# Patient Record
Sex: Female | Born: 1960
Health system: Southern US, Community
[De-identification: ages and names within clinical notes are randomized; demographics above are authoritative.]

## PROBLEM LIST (undated history)

## (undated) DIAGNOSIS — T7840XA Allergy, unspecified, initial encounter: Secondary | ICD-10-CM

## (undated) HISTORY — DX: Allergy, unspecified, initial encounter: T78.40XA

---

## 2001-06-18 ENCOUNTER — Encounter: Payer: Self-pay | Admitting: Ophthalmology

## 2001-06-18 ENCOUNTER — Encounter: Admission: RE | Admit: 2001-06-18 | Discharge: 2001-06-18 | Payer: Self-pay | Admitting: Ophthalmology

## 2007-07-20 ENCOUNTER — Other Ambulatory Visit: Admission: RE | Admit: 2007-07-20 | Discharge: 2007-07-20 | Payer: Self-pay | Admitting: Family Medicine

## 2010-02-09 ENCOUNTER — Ambulatory Visit: Payer: Self-pay | Admitting: Gynecology

## 2010-02-09 ENCOUNTER — Other Ambulatory Visit: Admission: RE | Admit: 2010-02-09 | Discharge: 2010-02-09 | Payer: Self-pay | Admitting: Gynecology

## 2010-06-20 ENCOUNTER — Encounter: Payer: Self-pay | Admitting: Family Medicine

## 2012-07-17 ENCOUNTER — Ambulatory Visit
Admission: RE | Admit: 2012-07-17 | Discharge: 2012-07-17 | Disposition: A | Discharge status: Home / Self Care | Payer: BC Managed Care – PPO | Source: Ambulatory Visit | Attending: Cardiovascular Disease | Admitting: Cardiovascular Disease

## 2012-07-17 ENCOUNTER — Other Ambulatory Visit: Payer: Self-pay | Admitting: Cardiovascular Disease

## 2012-07-17 DIAGNOSIS — R519 Headache, unspecified: Secondary | ICD-10-CM

## 2012-07-17 DIAGNOSIS — R110 Nausea: Secondary | ICD-10-CM

## 2013-08-06 ENCOUNTER — Ambulatory Visit (INDEPENDENT_AMBULATORY_CARE_PROVIDER_SITE_OTHER): Payer: BC Managed Care – PPO | Admitting: Emergency Medicine

## 2013-08-06 VITALS — BP 110/90 | HR 66 | Temp 98.2°F | Resp 16 | Ht <= 58 in | Wt 128.8 lb

## 2013-08-06 DIAGNOSIS — K2970 Gastritis, unspecified, without bleeding: Secondary | ICD-10-CM

## 2013-08-06 DIAGNOSIS — K2990 Gastroduodenitis, unspecified, without bleeding: Secondary | ICD-10-CM

## 2013-08-06 DIAGNOSIS — R1013 Epigastric pain: Secondary | ICD-10-CM

## 2013-08-06 LAB — POCT CBC
Granulocyte percent: 41.1 %G (ref 37–80)
HCT, POC: 44.5 % (ref 37.7–47.9)
Hemoglobin: 14 g/dL (ref 12.2–16.2)
Lymph, poc: 2.8 (ref 0.6–3.4)
MCH, POC: 30 pg (ref 27–31.2)
MCHC: 31.5 g/dL — AB (ref 31.8–35.4)
MCV: 95.3 fL (ref 80–97)
MID (cbc): 0.3 (ref 0–0.9)
MPV: 9.1 fL (ref 0–99.8)
POC Granulocyte: 2.2 (ref 2–6.9)
POC LYMPH PERCENT: 52.3 %L — AB (ref 10–50)
POC MID %: 6.6 %M (ref 0–12)
Platelet Count, POC: 288 10*3/uL (ref 142–424)
RBC: 4.67 M/uL (ref 4.04–5.48)
RDW, POC: 14.3 %
WBC: 5.3 10*3/uL (ref 4.6–10.2)

## 2013-08-06 MED ORDER — SUCRALFATE 1 G PO TABS: ORAL_TABLET | ORAL | Status: DC

## 2013-08-06 MED ORDER — ESOMEPRAZOLE MAGNESIUM 40 MG PO CPDR: 40.0000 mg | DELAYED_RELEASE_CAPSULE | Freq: Every day | ORAL | Status: DC

## 2013-08-06 NOTE — Patient Instructions (Signed)
Viêm D? Dày, Ng??i L?n  (Gastritis, Adult)  Viêm d? dày là hi?n t??ng ?au nh?c và s?ng (viêm) ? l?p niêm m?c d? dày. Viêm d? dày có th? phát tri?n nh? là m?t tình tr?ng kh?i phát ??t ng?t (c?p tính) ho?c kéo dài (m?n tính). N?u viêm d? dày không ???c ?i?u tr?, b?nh có th? d?n ??n ch?y máu và loét d? dày.   NGUYÊN NHÂN  Viêm d? dày xu?t hi?n khi l?p niêm m?c d? dày y?u ho?c b? t?n th??ng. Sau ?ó, d?ch tiêu hóa t? d? dày gây viêm niêm m?c d? dày b? suy y?u. Niêm m?c d? dày có th? y?u ho?c b? t?n th??ng do nhi?m vi rút ho?c vi khu?n. M?t nhi?m trùng do vi khu?n ph? bi?n là nhi?m trùng do Helicobacter pylori. Viêm d? dày c?ng có th? do u?ng r??u quá nhi?u, dùng m?t s? lo?i thu?c nh?t ??nh ho?c có quá nhi?u axit trong d? dày.  TRI?U CH?NG  Trong m?t s? tr??ng h?p, không có tri?u ch?ng. Khi có tri?u ch?ng, chúng có th? bao g?m:  · ?au ho?c c?m giác nóng rát ? vùng b?ng trên.  · Bu?n nôn.  · Nôn.  · C?m giác khó ch?u do no sau khi ?n.  CH?N ?OÁN  Chuyên gia ch?m sóc s?c kh?e có th? nghi ng? b?n b? viêm d? dày d?a vào các tri?u ch?ng c?a b?n và khám th?c th?. ?? xác ??nh nguyên nhân viêm d? dày, chuyên gia ch?m sóc s?c kh?e có th? th?c hi?n nh? sau:  · Xét nghi?m máu ho?c phân ?? ki?m tra vi khu?n H pylori.  · Soi d? dày. M?t ?ng m?ng, m?m (?èn n?i soi) ???c lu?n xu?ng th?c qu?n và vào d? dày. ?èn n?i soi có g?n ?èn và camera ? ??u. Chuyên gia ch?m sóc s?c kh?e s? d?ng ?èn n?i soi ?? xem bên trong d? dày.  · L?y m?u mô (sinh thi?t) t? d? dày ?? ki?m tra d??i kính hi?n vi.  ?I?U TR?  Tùy thu?c vào nguyên nhân gây viêm d? dày, thu?c có th? ???c kê ??n. N?u b?n b? nhi?m trùng do vi khu?n, ch?ng h?n nh? nhi?m trùng do H pylori, kháng sinh có th? ???c cho dùng. N?u viêm d? dày là do có quá nhi?u axit trong d? dày, thu?c ch?n H2 ho?c thu?c trung hòa axit có th? ???c cho dùng. Chuyên gia ch?m sóc s?c kh?e có th? khuy?n ngh? b?n nên ng?ng dùng atpirin, ibuprofen ho?c thu?c kháng viêm không có steroid khác (NSAID).  H??NG D?N CH?M  SÓC T?I NHÀ  · Ch? s? d?ng thu?c không c?n kê toa ho?c thu?c c?n kê toa theo ch? d?n c?a chuyên gia ch?m sóc s?c kh?e.  · N?u b?n ???c cho dùng thu?c kháng sinh, hãy dùng theo ch? d?n. Dùng h?t thu?c ngay c? khi b?n b?t ??u c?m th?y ?? h?n.  · U?ng ?? n??c ?? gi? cho n??c ti?u trong ho?c vàng nh?t.  · Tránh các lo?i th?c ph?m và ?? u?ng làm cho các tri?u ch?ng t?i t? h?n, ch?ng h?n nh?:  · Caffeine ho?c ?? u?ng có c?n.  · Sô cô la.  · B?c hà ho?c v? b?c hà.  · T?i và hành tây.  · Th?c ?n cay.  · Trái cây h? cam, ch?ng h?n nh? cam, chanh hay chanh tây.  · Các th?c ?n có cà chua, ch?ng h?n nh? n??c x?t, ?t, salsa (n??c x?t cay) và bánh   L?P T?C ?I KHM N?U:  Phn c mu ?en ho?c ?? s?m.  B?n nn ra mu ho?c ch?t gi?ng nh? b?t c ph.  B?n khng th? gi? ch?t l?ng trong ng??i.  B?n b? ?au b?ng tr?m tr?ng h?n.  B?n b? s?t.  B?n khng c?m th?y kh?e h?n sau 1 tu?n.  B?n c b?t c? cu h?i ho?c th?c m?c no khc. ??M B?O B?N:  Hi?u cc h??ng d?n ny.  S? theo di tnh tr?ng c?a mnh.  S? yu c?u tr? gip ngay l?p t?c n?u b?n c?m th?y khng ?? ho?c tnh tr?ng tr?m tr?ng h?n. Document Released: 05/16/2005 Document Revised: 01/16/2013 [facility] Patient Information 2014 [place]Care, [place].

## 2013-08-06 NOTE — Progress Notes (Signed)
[facility] [address], [place] [id number] [phone]  Date:  08/06/2013   Name:  Alexis Hickman   DOB:  03/13/1961   MRN:  [id number]  PCP:  No primary provider on file.    Chief Complaint: stomach hurts on left side, bloating, x 1 month   History of Present Illness:  Alexis Hickman is a 53 y.o. very pleasant female patient who presents with the following:  Ill for past month with epigastric pain. Worse after eating.  No specific food intolerance.  Non drinker, non smoker.  No excess ASA or NSAID.  No melena. No vomiting but has some post prandial nausea.  No BRBPR.  No stool change.  No history of reflux or PUD.  No weight loss.  Frequent spicy food.  No improvement with over the counter medications or other home remedies. Denies other complaint or health concern today.   There are no active problems to display for this patient.   Past Medical History  Diagnosis Date  . Allergy     History reviewed. No pertinent past surgical history.  History  Substance Use Topics  . Smoking status: Never Smoker   . Smokeless tobacco: Not on file  . Alcohol Use: No    History reviewed. No pertinent family history.  Allergies  Allergen Reactions  . Advil [Ibuprofen]     Makes itchy when taking    Medication list has been reviewed and updated.  No current outpatient prescriptions on file prior to visit.   No current facility-administered medications on file prior to visit.    Review of Systems:  As per HPI, otherwise negative.    Physical Examination: Filed Vitals:   08/06/13 1533  BP: 110/90  Pulse: 66  Temp: 98.2 F (36.8 C)  Resp: 16   Filed Vitals:   08/06/13 1533  Height: 4\' 10"  (1.473 m)  Weight: 128 lb 12.8 oz (58.423 kg)   Body mass index is 26.93 kg/(m^2). Ideal Body Weight: Weight in (lb) to have BMI = 25: 119.4  GEN: WDWN, NAD, Non-toxic, A & O x 3 HEENT: Atraumatic, Normocephalic. Neck supple. No masses, No LAD. Ears and Nose: No  external deformity. CV: RRR, No M/G/R. No JVD. No thrill. No extra heart sounds. PULM: CTA B, no wheezes, crackles, rhonchi. No retractions. No resp. distress. No accessory muscle use. ABD: S, tender epigastrium, ND, +BS. No rebound. No HSM. EXTR: No c/c/e NEURO Normal gait.  PSYCH: Normally interactive. Conversant. Not depressed or anxious appearing.  Calm demeanor.    Assessment and Plan: Gastritis vs ulcer Labs Prevacid carafate  Signed,  Phillips OdorJeffery Tamisha Nordstrom, MD   Results for orders placed in visit on 08/06/13  POCT CBC      Result Value Ref Range   WBC 5.3  4.6 - 10.2 K/uL   Lymph, poc 2.8  0.6 - 3.4   POC LYMPH PERCENT 52.3 (*) 10 - 50 %L   MID (cbc) 0.3  0 - 0.9   POC MID % 6.6  0 - 12 %M   POC Granulocyte 2.2  2 - 6.9   Granulocyte percent 41.1  37 - 80 %G   RBC 4.67  4.04 - 5.48 M/uL   Hemoglobin 14.0  12.2 - 16.2 g/dL   HCT, POC 29.544.5  62.137.7 - 47.9 %   MCV 95.3  80 - 97 fL   MCH, POC 30.0  27 - 31.2 pg   MCHC 31.5 (*) 31.8 - 35.4 g/dL  RDW, POC 14.3     Platelet Count, POC 288  142 - 424 K/uL   MPV 9.1  0 - 99.8 fL

## 2013-08-07 ENCOUNTER — Other Ambulatory Visit: Payer: Self-pay | Admitting: Emergency Medicine

## 2013-08-07 LAB — COMPREHENSIVE METABOLIC PANEL
ALT: 21 U/L (ref 0–35)
AST: 21 U/L (ref 0–37)
Albumin: 4.3 g/dL (ref 3.5–5.2)
Alkaline Phosphatase: 52 U/L (ref 39–117)
BUN: 10 mg/dL (ref 6–23)
CO2: 24 mEq/L (ref 19–32)
Calcium: 9 mg/dL (ref 8.4–10.5)
Chloride: 107 mEq/L (ref 96–112)
Creat: 0.56 mg/dL (ref 0.50–1.10)
Glucose, Bld: 90 mg/dL (ref 70–99)
Potassium: 4.3 mEq/L (ref 3.5–5.3)
Sodium: 139 mEq/L (ref 135–145)
Total Bilirubin: 0.2 mg/dL (ref 0.2–1.2)
Total Protein: 7.4 g/dL (ref 6.0–8.3)

## 2013-08-07 LAB — H. PYLORI ANTIBODY, IGG: H Pylori IgG: 1.23 {ISR} — ABNORMAL HIGH

## 2013-08-07 MED ORDER — AMOXICILL-CLARITHRO-LANSOPRAZ PO MISC: ORAL | Status: DC

## 2013-08-08 ENCOUNTER — Telehealth: Payer: Self-pay

## 2013-08-08 NOTE — Telephone Encounter (Signed)
Pt's son called and reported to operator that the pt's Rxs are too expensive and I advised I would call pharmacist. Pharm stated that all t 3 of the Rxs are very expensive, even breaking the Prev pak up, but that pt is going to check w/ins to see if they have Rx coverage and take that back to pharm. [facility].

## 2014-08-26 ENCOUNTER — Ambulatory Visit (INDEPENDENT_AMBULATORY_CARE_PROVIDER_SITE_OTHER): Payer: BLUE CROSS/BLUE SHIELD | Admitting: Physician Assistant

## 2014-08-26 VITALS — BP 114/66 | HR 67 | Temp 97.9°F | Resp 20 | Ht <= 58 in | Wt 133.1 lb

## 2014-08-26 DIAGNOSIS — B9789 Other viral agents as the cause of diseases classified elsewhere: Principal | ICD-10-CM

## 2014-08-26 DIAGNOSIS — J069 Acute upper respiratory infection, unspecified: Secondary | ICD-10-CM | POA: Diagnosis not present

## 2014-08-26 MED ORDER — IPRATROPIUM BROMIDE 0.03 % NA SOLN: 2.0000 | Freq: Two times a day (BID) | NASAL | Status: DC

## 2014-08-26 MED ORDER — HYDROCOD POLST-CHLORPHEN POLST 10-8 MG/5ML PO LQCR: 5.0000 mL | Freq: Two times a day (BID) | ORAL | Status: DC | PRN

## 2014-08-26 MED ORDER — BENZONATATE 100 MG PO CAPS: 100.0000 mg | ORAL_CAPSULE | Freq: Three times a day (TID) | ORAL | Status: DC | PRN

## 2014-08-26 NOTE — Patient Instructions (Signed)
Take tessalon during the day. Use cough syrup at night to help you sleep. Use nasal spray twice a day. Return in 7-10 days if your symptoms are not improving.

## 2014-08-26 NOTE — Progress Notes (Signed)
Subjective:    Patient ID: Alexis Hickman, female    DOB: 04/28/1961, 54 y.o.   MRN: [id number]  HPI  This is a 54 year old female with no PMH who is presenting with dry cough, rhinorrhea and sore throat x 1 week. Feeling light-headed with headache at times. Having stomach pain when she coughs. Denies fever, chills, otalgia, N/V, SOB or wheezing. Not taking any medications for her current symptoms. No history of lung problems and not a smoker. No history of environmental allergies.  Review of Systems  Constitutional: Negative for fever and chills.  HENT: Positive for rhinorrhea and sore throat. Negative for ear pain.   Eyes: Negative for redness.  Respiratory: Positive for cough. Negative for shortness of breath and wheezing.   Gastrointestinal: Positive for abdominal pain. Negative for nausea, vomiting and diarrhea.  Skin: Negative for rash.  Allergic/Immunologic: Negative for environmental allergies.  Neurological: Positive for light-headedness and headaches.  Hematological: Negative for adenopathy.  Psychiatric/Behavioral: Negative for sleep disturbance.    There are no active problems to display for this patient.  Prior to Admission medications   Medication Sig Start Date End Date Taking? Authorizing Provider  amoxicillin-clarithromycin-lansoprazole [facility](PREVPAC) combo pack Follow package directions. Dispense this as generic components if less expensive.  Thanks Patient not taking: Reported on 08/26/2014 08/07/13   Carmelina DaneJeffery S Anderson, MD  esomeprazole (NEXIUM) 40 MG capsule Take 1 capsule (40 mg total) by mouth daily. Patient not taking: Reported on 08/26/2014 08/06/13   Carmelina DaneJeffery S Anderson, MD  sucralfate (CARAFATE) 1 G tablet 1 tab 1 hr ac and hs Patient not taking: Reported on 08/26/2014 08/06/13   Carmelina DaneJeffery S Anderson, MD   Allergies  Allergen Reactions  . Advil [Ibuprofen]     Makes itchy when taking   Patient's social and family history were reviewed.     Objective:   Physical Exam    Constitutional: She is oriented to person, place, and time. She appears well-developed and well-nourished. No distress.  HENT:  Head: Normocephalic and atraumatic.  Right Ear: Hearing, external ear and ear canal normal. Tympanic membrane is retracted.  Left Ear: Hearing, external ear and ear canal normal. Tympanic membrane is retracted.  Nose: Nose normal. Right sinus exhibits no maxillary sinus tenderness and no frontal sinus tenderness. Left sinus exhibits no maxillary sinus tenderness and no frontal sinus tenderness.  Mouth/Throat: Uvula is midline, oropharynx is clear and moist and mucous membranes are normal. No oropharyngeal exudate, posterior oropharyngeal edema or posterior oropharyngeal erythema.  Eyes: Conjunctivae and lids are normal. Right eye exhibits no discharge. Left eye exhibits no discharge. No scleral icterus.  Cardiovascular: Normal rate, regular rhythm, normal heart sounds, intact distal pulses and normal pulses.   Pulmonary/Chest: Effort normal and breath sounds normal. No respiratory distress. She has no wheezes. She has no rhonchi. She has no rales.  Abdominal: Soft. Normal appearance.  Mild tenderness in upper abdomen  Musculoskeletal: Normal range of motion.  Lymphadenopathy:       Head (right side): No submental, no submandibular and no tonsillar adenopathy present.       Head (left side): No submental, no submandibular and no tonsillar adenopathy present.    She has no cervical adenopathy.  Neurological: She is alert and oriented to person, place, and time.  Skin: Skin is warm, dry and intact. No lesion and no rash noted.  Psychiatric: She has a normal mood and affect. Her speech is normal and behavior is normal. Thought content normal.   BP  114/66 mmHg  Pulse 67  Temp(Src) 97.9 F (36.6 C) (Oral)  Resp 20  Ht  (1.473 m)  Wt 133 lb 2 oz (60.385 kg)  BMI 27.83 kg/m2  SpO2 100%     Assessment & Plan:  1. Viral URI with cough Etiology likely viral.  Focus is on supportive care, see meds prescribed below. She will return in 7-10 days if symptoms do not improve.  - ipratropium (ATROVENT) 0.03 % nasal spray; Place 2 sprays into both nostrils 2 (two) times daily.  Dispense: 30 mL; Refill: 0 - chlorpheniramine-HYDROcodone (TUSSIONEX PENNKINETIC ER) 10-8 MG/5ML LQCR; Take 5 mLs by mouth every 12 (twelve) hours as needed for cough (cough).  Dispense: 80 mL; Refill: 0 - benzonatate (TESSALON) 100 MG capsule; Take 1-2 capsules (100-200 mg total) by mouth 3 (three) times daily as needed for cough.  Dispense: 40 capsule; Refill: 0   [place] Miners. Dyke Brackett, MHS [facility]  08/26/2014

## 2015-10-05 ENCOUNTER — Other Ambulatory Visit: Payer: Self-pay | Admitting: Urgent Care

## 2015-10-05 ENCOUNTER — Ambulatory Visit (INDEPENDENT_AMBULATORY_CARE_PROVIDER_SITE_OTHER): Payer: BLUE CROSS/BLUE SHIELD | Admitting: Urgent Care

## 2015-10-05 VITALS — BP 104/68 | HR 100 | Temp 97.8°F | Resp 18 | Ht <= 58 in | Wt 133.0 lb

## 2015-10-05 DIAGNOSIS — R51 Headache: Secondary | ICD-10-CM | POA: Diagnosis not present

## 2015-10-05 DIAGNOSIS — R519 Headache, unspecified: Secondary | ICD-10-CM

## 2015-10-05 DIAGNOSIS — R42 Dizziness and giddiness: Secondary | ICD-10-CM | POA: Diagnosis not present

## 2015-10-05 DIAGNOSIS — R5383 Other fatigue: Secondary | ICD-10-CM

## 2015-10-05 LAB — POCT CBC
GRANULOCYTE PERCENT: 45.7 % (ref 37–80)
HCT, POC: 42.2 % (ref 37.7–47.9)
Hemoglobin: 14.7 g/dL (ref 12.2–16.2)
LYMPH, POC: 3 (ref 0.6–3.4)
MCH, POC: 30.8 pg (ref 27–31.2)
MCHC: 34.9 g/dL (ref 31.8–35.4)
MCV: 88.3 fL (ref 80–97)
MID (CBC): 0.5 (ref 0–0.9)
MPV: 7 fL (ref 0–99.8)
POC GRANULOCYTE: 3 (ref 2–6.9)
POC LYMPH %: 46.3 % (ref 10–50)
POC MID %: 8 % (ref 0–12)
Platelet Count, POC: 200 10*3/uL (ref 142–424)
RBC: 4.78 M/uL (ref 4.04–5.48)
RDW, POC: 13.3 %
WBC: 6.5 10*3/uL (ref 4.6–10.2)

## 2015-10-05 LAB — COMPREHENSIVE METABOLIC PANEL
ALT: 21 U/L (ref 6–29)
AST: 19 U/L (ref 10–35)
Albumin: 4.3 g/dL (ref 3.6–5.1)
Alkaline Phosphatase: 39 U/L (ref 33–130)
BUN: 22 mg/dL (ref 7–25)
CALCIUM: 9.3 mg/dL (ref 8.6–10.4)
CO2: 27 mmol/L (ref 20–31)
Chloride: 106 mmol/L (ref 98–110)
Creat: 0.59 mg/dL (ref 0.50–1.05)
Glucose, Bld: 93 mg/dL (ref 65–99)
POTASSIUM: 4.4 mmol/L (ref 3.5–5.3)
SODIUM: 140 mmol/L (ref 135–146)
TOTAL PROTEIN: 7.6 g/dL (ref 6.1–8.1)
Total Bilirubin: 0.4 mg/dL (ref 0.2–1.2)

## 2015-10-05 LAB — POCT URINALYSIS DIP (MANUAL ENTRY)
BILIRUBIN UA: NEGATIVE
BILIRUBIN UA: NEGATIVE
GLUCOSE UA: NEGATIVE
Leukocytes, UA: NEGATIVE
Nitrite, UA: NEGATIVE
RBC UA: NEGATIVE
SPEC GRAV UA: 1.015
UROBILINOGEN UA: 0.2
pH, UA: 5

## 2015-10-05 LAB — TSH: TSH: 0.17 mIU/L — ABNORMAL LOW

## 2015-10-05 MED ORDER — NAPROXEN SODIUM 550 MG PO TABS: 550.0000 mg | ORAL_TABLET | Freq: Two times a day (BID) | ORAL | Status: DC

## 2015-10-05 NOTE — Progress Notes (Signed)
MRN: [id number] DOB: 01/27/1961  Subjective:   Alexis Hickman is a 55 y.o. female presenting for chief complaint of Headache; Fatigue; and Dizziness  Reports 4 month history headache, occurs daily, located over her forehead, associated with neck pain, constant. She also has intermittent fatigue, dizziness. Dizziness occurs when she stands for really long period Has not tried any medications for relief. 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 Patient does not hydrate well, eats erratically. Sleep is difficult due to her work schedule. Denies fever, sinus pain, ear pain, chest pain, shob, n/v, constipation, abdominal pain, dysuria, leg swelling.  Alexis Hickman currently has no medications in their medication list. Also is allergic to advil.  Alexis Hickman  has a past medical history of Allergy. Also  has no past surgical history on file.  Objective:   Vitals: BP 104/68 mmHg  Pulse 100  Temp(Src) 97.8 F (36.6 C) (Oral)  Resp 18  Ht 4\' 10"  (1.473 m)  Wt 133 lb (60.328 kg)  BMI 27.80 kg/m2  SpO2 98%  Physical Exam  Constitutional: She is oriented to person, place, and time. She appears well-developed and well-nourished.  HENT:  Mouth/Throat: Oropharynx is clear and moist.  Eyes: EOM are normal. Pupils are equal, round, and reactive to light. Right eye exhibits no discharge. Left eye exhibits no discharge. No scleral icterus.  Neck: Normal range of motion. Neck supple.  Cardiovascular: Normal rate, regular rhythm and intact distal pulses.   Pulmonary/Chest: No respiratory distress. She has no wheezes. She has no rales.  Abdominal: Soft. Bowel sounds are normal. She exhibits no distension and no mass. There is no tenderness.  Musculoskeletal: She exhibits no edema.  Neurological: She is alert and oriented to person, place, and time. She has normal reflexes. No cranial nerve deficit.  Skin: Skin is warm and dry.   Results for orders placed or  performed in visit on 10/05/15 (from the past 24 hour(s))  POCT CBC     Status: None   Collection Time: 10/05/15 12:09 PM  Result Value Ref Range   WBC 6.5 4.6 - 10.2 K/uL   Lymph, poc 3.0 0.6 - 3.4   POC LYMPH PERCENT 46.3 10 - 50 %L   MID (cbc) 0.5 0 - 0.9   POC MID % 8.0 0 - 12 %M   POC Granulocyte 3.0 2 - 6.9   Granulocyte percent 45.7 37 - 80 %G   RBC 4.78 4.04 - 5.48 M/uL   Hemoglobin 14.7 12.2 - 16.2 g/dL   HCT, POC 40.942.2 81.137.7 - 47.9 %   MCV 88.3 80 - 97 fL   MCH, POC 30.8 27 - 31.2 pg   MCHC 34.9 31.8 - 35.4 g/dL   RDW, POC 91.413.3 %   Platelet Count, POC 200 142 - 424 K/uL   MPV 7.0 0 - 99.8 fL  POCT urinalysis dipstick     Status: Abnormal   Collection Time: 10/05/15 12:09 PM  Result Value Ref Range   Color, UA yellow yellow   Clarity, UA clear clear   Glucose, UA negative negative   Bilirubin, UA negative negative   Ketones, POC UA negative negative   Spec Grav, UA 1.015    Blood, UA negative negative   pH, UA 5.0    Protein Ur, POC trace (A) negative   Urobilinogen, UA 0.2    Nitrite, UA Negative Negative   Leukocytes, UA Negative Negative   Assessment  and Plan :   1. Chronic nonintractable headache, unspecified headache type 2. Other fatigue 3. Dizziness - Patient's headache is more tension/stress type than any other due to stress from work, lack of sleep, hydration and erratic eating habits. I provided patient with note for work limiting her hours to 40 per week. Patient was in agreement. I also discussed that if she is going to continue to have issues with her work and taking care of herself that she should really try and find another job. Patient's daughter, Alexis Hickman, was present throughout the entire visit and helped with communication. Lab results should be reported to her at [phone].  Wallis Bamberg, PA-C [facility] [phone] 10/05/2015 11:41 AM

## 2015-10-05 NOTE — Patient Instructions (Addendum)
Please make sure that you are limiting your work hours to 40 hours per week. Eat 3 healthy, balanced meals daily. Drink 2 liters of water daily. Sleep 7-8 hours per night. You may use Anaprox for headaches but this will not address the issue that you have with your work schedule. I will let you know about your lab results and you should look to follow up with me here at the walk-in-clinic in ~1 month or sooner if your headaches persist despite making the changes I recommended above.   ?au ??u do c?ng th?ng (Tension Headache) ?au ??u do c?ng th?ng l c?m gic ?au, p l?c ho?c nh?c nh?i, th??ng c?m th?y ? pha tr??c ho?c hai bn ??u. ?y l lo?i ?au ??u ph? bi?n nh?t. C?n ?au c th? l m ?, ho?c c th? c?m th?y c?ng th?ng (th?t l?i). ?au ??u do c?ng th?ng th??ng khng km theo bu?n nn ho?c nn v khng n?ng h?n khi ho?t ??ng th? ch?t. ?au ??u do c?ng th?ng th??ng ko di t? 30 pht ??n vi ngy. NGUYN NHN Khng r nguyn nhn chnh xc gy ra tnh tr?ng ny. Nh?c ??u do c?ng th?ng th??ng b?t ??u sau khi b? c?ng th?ng, h?i h?p ho?c tr?m c?m. Nh?ng tc nhn kh?i pht khc c th? bao g?m:  R??u.  Qu nhi?u caffein, ho?c cai caffein.  Nhi?m trng ???ng h h?p, ch?ng h?n nh? c?m l?nh, cm ho?c nhi?m trng xoang.  Nh?ng v?n ?? v? nha khoa ho?c nghi?n r?ng.  M?t m?i.  Gi? ??u v c? c?a qu v? ? cng v? tr trong m?t th?i gian di, ch?ng h?n nh? khi s? d?ng my tnh.  Ht thu?c l. TRI?U CH?NG Nh?ng tri?u ch?ng c?a tnh tr?ng ny bao g?m:  M?t c?m gic p l?c quanh ??u.  ?au ??u m ?, nh?c nh?i.  C?m th?y ?au ? pha tr??c v hai bn ??u.  Nh?y c?m ?au cc c? ? ??u, c? v vai. CH?N ?ON Tnh tr?ng ny c th? ???c ch?n ?on d?a vo cc tri?u ch?ng c?a qu v? v khm th?c th?. C th? c?n lm cc ki?m tra, ch?ng h?n nh? ch?p CT ho?c ch?p MRI ??u. Cc ki?m tra ny c th? ???c th?c hi?n n?u tri?u ch?ng c?a qu v? n?ng ho?c b?t th??ng. ?I?U TR? Tnh tr?ng ny c th? ???c ?i?u tr? b?ng cch thay  ??i l?i s?ng v cc lo?i thu?c gip gi?m tri?u ch?ng. H??NG D?N CH?M [place] T?I NH Qu?n l c?n ?au  Ch? s? d?ng thu?c khng c?n k ??n v thu?c c?n k ??n theo ch? d?n c?a chuyn gia ch?m [place] s?c kh?e.  N?m trong m?t phng t?i, yn t?nh khi qu v? b? ?au ??u.  N?u ???c ch? d?n, ch??m ? l?nh vo vng ??u v c?:  Cho ? l?nh vo ti nh?a.  ?? kh?n t?m ? gi?a da v ti ch??m.  Ch??m ? l?nh trong 20 pht, 2-3 l?n m?i ngy.  S? d?ng ??m nhi?t ho?c t?m n??c nng ?? t?ng nhi?t vo vng ??u v c? theo ch? d?n c?a chuyn gia ch?m [place] s?c kh?e. ?n v u?ng  ?n u?ng theo l?ch bnh th??ng.  H?n ch? u?ng r??u.  Gi?m l??ng u?ng caffein, ho?c d?ng dng caffein. H??ng d?n chung  Tun th? t?t c? cc cu?c h?n khm l?i theo ch? d?n c?a chuyn gia ch?m [place] s?c kh?e. ?i?u ny c vai tr quan tr?ng.  Ghi nh?t k ?au ??u hng ngy ??  gip tm ra ?i?u g c th? gy cc c?n ?au ??u. V d?, hy ghi l?i:  Qu v? ?n v u?ng g.  Qu v? ? ng? bao lu.  B?t k? thay ??i no trong ch? ?? ?n ho?c thu?c men.  Th? k? thu?t xoa bp ho?c cc k? thu?t th? gin khc.  H?n ch? c?ng th?ng.  Ng?i th?ng l?ng v trnh c?ng cc c?.  Khng s? d?ng cc s?n ph?m thu?c l no, bao g?m thu?c l d?ng ht, thu?c l d?ng nhai ho?c thu?c l ?i?n t?. N?u qu v? c?n gip ?? ?? cai thu?c, hy h?i chuyn gia ch?m [place] s?c kh?e.  T?p th? d?c th??ng xuyn theo ch? d?n c?a chuyn gia ch?m [place] s?c kh?e.  Ng? 7-9 ti?ng, ho?c th?i gian ng? theo khuy?n ngh? c?a chuyn gia ch?m [place] s?c kh?e. ?I KHM N?U:  Tri?u ch?ng c?a qu v? khng c?i thi?n ???c b?ng thu?c.  Qu v? c c?n ?au ??u khc v?i nh?ng g m qu v? th??ng c?m th?y.  Qu v? b? bu?n nn ho?c qu v? nn.  Qu v? b? s?t. NGAY L?P T?C ?I KHM N?U:  Qu v? ?au ??u n?ng h?n.  Qu v? lin t?c b? nn m?a.  Qu v? b? c?ng c?.  Qu v? khng nhn th?y g.  Qu v? b? kh ni.  Qu v? b? ?au ? m?t ho?c tai.  Qu v? b? y?u c? ho?c m?t ki?m sot c?.  Qu v? m?t  th?ng b?ng ho?c ?i l?i kh kh?n.  Qu v? c?m th?y mu?n ng?t ho?c qu v? ng?t.  Qu v? b? l l?n.   Thng tin ny khng nh?m m?c ?ch thay th? cho l?i khuyn m chuyn gia ch?m [place] s?c kh?e ni v?i qu v?. Hy b?o ??m qu v? ph?i th?o lu?n b?t k? v?n ?? g m qu v? c v?i chuyn gia ch?m  s?c kh?e c?a qu v?.   Document Released: 05/16/2005 Document Revised: 02/04/2015 Elsevier Interactive Patient Education [employer].     IF you received an x-ray today, you will receive an invoice from [facility]. Please contact [facility] at [phone] with questions or concerns regarding your invoice.   IF you received labwork today, you will receive an invoice from [employer]. Please contact [facility] at [phone] with questions or concerns regarding your invoice.   Our billing staff will not be able to assist you with questions regarding bills from these companies.  You will be contacted with the lab results as soon as they are available. The fastest way to get your results is to activate your My Chart account. Instructions are located on the last page of this paperwork. If you have not heard from [place] regarding the results in 2 weeks, please contact this office.

## 2015-10-06 LAB — T3: T3 TOTAL: 102 ng/dL (ref 76–181)

## 2015-10-06 LAB — T4, FREE: FREE T4: 1.3 ng/dL (ref 0.8–1.8)

## 2015-12-26 ENCOUNTER — Ambulatory Visit (INDEPENDENT_AMBULATORY_CARE_PROVIDER_SITE_OTHER): Payer: BLUE CROSS/BLUE SHIELD | Admitting: Family Medicine

## 2015-12-26 VITALS — BP 120/88 | HR 67 | Temp 98.0°F | Resp 20 | Ht 58.75 in | Wt 133.1 lb

## 2015-12-26 DIAGNOSIS — R1084 Generalized abdominal pain: Secondary | ICD-10-CM | POA: Diagnosis not present

## 2015-12-26 DIAGNOSIS — Z789 Other specified health status: Secondary | ICD-10-CM | POA: Diagnosis not present

## 2015-12-26 LAB — COMPLETE METABOLIC PANEL WITH GFR
ALBUMIN: 4.3 g/dL (ref 3.6–5.1)
ALK PHOS: 40 U/L (ref 33–130)
ALT: 16 U/L (ref 6–29)
AST: 17 U/L (ref 10–35)
BUN: 16 mg/dL (ref 7–25)
CHLORIDE: 106 mmol/L (ref 98–110)
CO2: 26 mmol/L (ref 20–31)
CREATININE: 0.66 mg/dL (ref 0.50–1.05)
Calcium: 9 mg/dL (ref 8.6–10.4)
GFR, Est African American: >89 mL/min (ref >=60)
GFR, Est Non African American: >89 mL/min (ref >=60)
GLUCOSE: 91 mg/dL (ref 65–99)
POTASSIUM: 4.3 mmol/L (ref 3.5–5.3)
SODIUM: 143 mmol/L (ref 135–146)
Total Bilirubin: 0.5 mg/dL (ref 0.2–1.2)
Total Protein: 7.3 g/dL (ref 6.1–8.1)

## 2015-12-26 LAB — POCT CBC
GRANULOCYTE PERCENT: 43.2 % (ref 37–80)
HCT, POC: 40.2 % (ref 37.7–47.9)
Hemoglobin: 13.7 g/dL (ref 12.2–16.2)
Lymph, poc: 2.9 (ref 0.6–3.4)
MCH, POC: 30.2 pg (ref 27–31.2)
MCHC: 34.1 g/dL (ref 31.8–35.4)
MCV: 88.5 fL (ref 80–97)
MID (cbc): 0.4 (ref 0–0.9)
MPV: 7.5 fL (ref 0–99.8)
PLATELET COUNT, POC: 193 10*3/uL (ref 142–424)
POC Granulocyte: 2.5 (ref 2–6.9)
POC LYMPH %: 50.2 % — AB (ref 10–50)
POC MID %: 6.6 %M (ref 0–12)
RBC: 4.54 M/uL (ref 4.04–5.48)
RDW, POC: 13.3 %
WBC: 5.8 10*3/uL (ref 4.6–10.2)

## 2015-12-26 LAB — POC MICROSCOPIC URINALYSIS (UMFC): MUCUS RE: ABSENT

## 2015-12-26 LAB — POCT URINALYSIS DIP (MANUAL ENTRY)
BILIRUBIN UA: NEGATIVE
BILIRUBIN UA: NEGATIVE
Blood, UA: NEGATIVE
Glucose, UA: NEGATIVE
LEUKOCYTES UA: NEGATIVE
Nitrite, UA: NEGATIVE
Protein Ur, POC: 30 — AB
Spec Grav, UA: 1.015
Urobilinogen, UA: 0.2
pH, UA: 7

## 2015-12-26 MED ORDER — OMEPRAZOLE 20 MG PO CPDR: 20.0000 mg | DELAYED_RELEASE_CAPSULE | Freq: Every day | ORAL | 1 refills | Status: DC

## 2015-12-26 NOTE — Progress Notes (Signed)
Subjective:  By signing my name below, I, [name], attest that this documentation has been prepared under the direction and in the presence of [name], MD. Electronically Signed: [name], Scribe. 12/26/2015 , 12:52 PM .  Patient was seen in Room 1 .   Patient ID: Alexis Hickman, female    DOB: Jan 12, 1961, 55 y.o.   MRN: [id number] Chief Complaint  Patient presents with  . Abdominal Pain   HPI Alexis Hickman is a 55 y.o. female Here for abdominal pain ongoing for a month now. Patient reports having cramping abdominal pain up to the sternal area, occurring after eating, ongoing for about a long time now, worsening over past month. She notes having an episode of vomiting after eating meat. She notes having a bowel movement everyday, last one was yesterday. She denies chest pain, fever, diarrhea, constipation, blood in stool or urinary symptoms. She denies having colonoscopy done prior.   A live video remote interpreter was called for translation: patient's primary language is [place]  Interpreter: Gaylyn Rong, [id number]   She is brought in by her son, who helped with some translation.   There are no active problems to display for this patient.  Past Medical History:  Diagnosis Date  . Allergy    No past surgical history on file. Allergies  Allergen Reactions  . Advil [Ibuprofen]     Makes itchy when taking   Prior to Admission medications   Not on File   Social History   Social History  . Marital status: Married    Spouse name: N/A  . Number of children: N/A  . Years of education: N/A   Occupational History  . Not on file.   Social History Main Topics  . Smoking status: Never Smoker  . Smokeless tobacco: Not on file  . Alcohol use No  . Drug use: No  . Sexual activity: Not on file   Other Topics Concern  . Not on file   Social History Narrative  . No narrative on file   Review of Systems  Constitutional: Negative for chills, fatigue and fever.  Cardiovascular:  Negative for chest pain.  Gastrointestinal: Positive for abdominal pain. Negative for blood in stool, constipation, diarrhea, nausea and vomiting.  Genitourinary: Negative for dysuria, frequency and urgency.       Objective:   Physical Exam  Constitutional: She is oriented to person, place, and time. She appears well-developed and well-nourished. No distress.  HENT:  Head: Normocephalic and atraumatic.  Eyes: EOM are normal. Pupils are equal, round, and reactive to light.  Neck: Neck supple.  Cardiovascular: Normal rate.   Pulmonary/Chest: Effort normal. No respiratory distress.  Abdominal: There is tenderness in the epigastric area. There is no rebound and no guarding.  Musculoskeletal: Normal range of motion.  Neurological: She is alert and oriented to person, place, and time.  Skin: Skin is warm and dry.  Psychiatric: She has a normal mood and affect. Her behavior is normal.  Nursing note and vitals reviewed.   Vitals:   12/26/15 1129  BP: 120/88  Pulse: 67  Resp: 20  Temp: 98 F (36.7 C)  TempSrc: Oral  SpO2: 95%  Weight: 133 lb 2 oz (60.4 kg)  Height: 4' 10.75" (1.492 m)   Results for orders placed or performed in visit on 12/26/15  POCT CBC  Result Value Ref Range   WBC 5.8 4.6 - 10.2 K/uL   Lymph, poc 2.9 0.6 - 3.4   POC LYMPH PERCENT 50.2 (  A) 10 - 50 %L   MID (cbc) 0.4 0 - 0.9   POC MID % 6.6 0 - 12 %M   POC Granulocyte 2.5 2 - 6.9   Granulocyte percent 43.2 37 - 80 %G   RBC 4.54 4.04 - 5.48 M/uL   Hemoglobin 13.7 12.2 - 16.2 g/dL   HCT, POC 04.5 40.9 - 47.9 %   MCV 88.5 80 - 97 fL   MCH, POC 30.2 27 - 31.2 pg   MCHC 34.1 31.8 - 35.4 g/dL   RDW, POC 81.1 %   Platelet Count, POC 193 142 - 424 K/uL   MPV 7.5 0 - 99.8 fL  POCT urinalysis dipstick  Result Value Ref Range   Color, UA yellow yellow   Clarity, UA clear clear   Glucose, UA negative negative   Bilirubin, UA negative negative   Ketones, POC UA negative negative   Spec Grav, UA 1.015     Blood, UA negative negative   pH, UA 7.0    Protein Ur, POC =30 (A) negative   Urobilinogen, UA 0.2    Nitrite, UA Negative Negative   Leukocytes, UA Negative Negative  POCT Microscopic Urinalysis ([facility])  Result Value Ref Range   WBC,UR,HPF,POC None None WBC/hpf   RBC,UR,HPF,POC None None RBC/hpf   Bacteria None None, Too numerous to count   Mucus Absent Absent   Epithelial Cells, UR Per Microscopy None None, Too numerous to count cells/hpf       Assessment & Plan:    Anicka Stuckert is a 55 y.o. female Generalized abdominal pain - Plan: POCT CBC, COMPLETE METABOLIC PANEL WITH GFR, POCT urinalysis dipstick, POCT Microscopic Urinalysis ([facility]), H. pylori breath test, omeprazole (PRILOSEC) 20 MG capsule, Ambulatory referral to Gastroenterology  Language barrier  Possible gastritis/GERD. Reassuring CBC and urinalysis. Trigger avoidance discussed, try omeprazole once per day, RTC precautions if not improving. Will check CMP, H. pylori testing, and referred to gastroenterology given this pain has been ongoing for some time, and has not had colon cancer screening. Video Interpreter was used, understanding expressed.  Meds ordered this encounter  Medications  . omeprazole (PRILOSEC) 20 MG capsule    Sig: Take 1 capsule (20 mg total) by mouth daily.    Dispense:  30 capsule    Refill:  1   Patient Instructions   start omeprazole 1 pill once per day. Avoid foods below that can make heartburn worse. I will check liver tests, and bacteria test that can cause ulcers.  Will also send you to stomach specialist.  If any worsening of your abdominal pain, return here or the emergency room.   Food Choices for Gastroesophageal Reflux Disease, Adult When you have gastroesophageal reflux disease (GERD), the foods you eat and your eating habits are very important. Choosing the right foods can help ease the discomfort of GERD. WHAT GENERAL GUIDELINES DO I NEED TO FOLLOW?  Choose fruits, vegetables, whole  grains, low-fat dairy products, and low-fat meat, fish, and poultry.  Limit fats such as oils, salad dressings, butter, nuts, and avocado.  Keep a food diary to identify foods that cause symptoms.  Avoid foods that cause reflux. These may be different for different people.  Eat frequent small meals instead of three large meals each day.  Eat your meals slowly, in a relaxed setting.  Limit fried foods.  Cook foods using methods other than frying.  Avoid drinking alcohol.  Avoid drinking large amounts of liquids with your meals.  Avoid bending over  or lying down until 2-3 hours after eating. WHAT FOODS ARE NOT RECOMMENDED? The following are some foods and drinks that may worsen your symptoms: Vegetables Tomatoes. Tomato juice. Tomato and spaghetti sauce. Chili peppers. Onion and garlic. Horseradish. Fruits Oranges, grapefruit, and lemon (fruit and juice). Meats High-fat meats, fish, and poultry. This includes hot dogs, ribs, ham, sausage, salami, and bacon. Dairy Whole milk and chocolate milk. Sour cream. Cream. Butter. Ice cream. Cream cheese.  Beverages Coffee and tea, with or without caffeine. Carbonated beverages or energy drinks. Condiments Hot sauce. Barbecue sauce.  Sweets/Desserts Chocolate and cocoa. Donuts. Peppermint and spearmint. Fats and Oils High-fat foods, including [place] fries and potato chips. Other Vinegar. Strong spices, such as black pepper, white pepper, red pepper, cayenne, curry powder, cloves, ginger, and chili powder. The items listed above may not be a complete list of foods and beverages to avoid. Contact your dietitian for more information.   This information is not intended to replace advice given to you by your health care provider. Make sure you discuss any questions you have with your health care provider.   Document Released: 05/16/2005 Document Revised: 06/06/2014 Document Reviewed: 03/20/2013 Elsevier Interactive Patient Education 2016  Elsevier Inc.  ?au b?ng, Ng??i l?n (Abdominal Pain, Adult) C nhi?u nguyn nhn d?n ??n ?au b?ng. Thng th??ng ?au b?ng l do m?t b?nh gy ra v s? khng ?? n?u khng ?i?u tr?Marland Kitchen B?nh ny c th? ???c theo di v ?i?u tr? t?i nh. Chuyn gia ch?m [place] s?c kh?e s? ti?n hnh khm th?c th? v c th? yu c?u lm xt nghi?m mu v ch?p X quang ?? xc ??nh m?c ?? nghim tr?ng c?a c?n ?au b?ng. Tuy nhin, trong nhi?u tr??ng h?p, ph?i m?t nhi?u th?i gian h?n ?? xc ??nh r nguyn nhn gy ?au b?ng. Tr??c khi tm ra nguyn nhn, chuyn gia ch?m [place] s?c kh?e c th? khng bi?t li?u qu v? c c?n lm thm xt nghi?m ho?c ti?p t?c ?i?u tr? hay khng. H??NG D?N CH?M [place] T?I NH  Theo di c?n ?au b?ng xem c b?t k? thay ??i no khng. Nh?ng hnh ??ng sau c th? gip lo?i b? b?t c? c?m gic kh ch?u no qu v? ?ang b?.  Ch? s? d?ng thu?c khng c?n k ??n ho?c thu?c c?n k ??n theo ch? d?n c?a chuyn gia ch?m [place] s?c kh?e.  Khng dng thu?c nhu?n trng tr? khi ???c chuyn gia ch?m [place] s?c kh?e ch? ??nh.  Th? dng m?t ch? ?? ?n l?ng (n??c lu?c th?t, tr, ho?c n??c) theo ch? d?n c?a chuyn gia ch?m [place] s?c kh?e. Chuy?n d?n sang m?t ch? ?? ?n nh? n?u ch?u ???c. ?I KHM N?U:  Qu v? b? ?au b?ng khng r nguyn nhn.  Qu v? b? ?au b?ng km theo bu?n nn ho?c tiu ch?y.  Qu v? b? ?au khi ?i ti?u ho?c ?i ngoi.  Qu v? b? c?n ?au b?ng lm th?c gi?c vo ban ?m.  Qu v? b? ?au b?ng n?ng thm ho?c ?? h?n khi ?n.  Qu v? b? ?au b?ng n?ng thm khi ?n ?? ?n nhi?u ch?t bo.  Qu v? b? s?t. NGAY L?P T?C ?I KHM N?U:   C?n ?au khng kh?i trong vng 2 gi?Ladell Heads v? v?n ti?p t?c b? i (nn m?a).  Ch? c th? c?m th?y ?au ? m?t s? ph?n b?ng, ch?ng h?n nh? ? ph?n b?ng bn ph?i ho?c bn d??i tri.  Phn c?a qu v? c mu ho?c  c mu ?en nh? h?c n. ??M B?O QU V?:  Hi?u r cc h??ng d?n ny.  S? theo di tnh tr?ng c?a mnh.  S? yu c?u tr? gip ngay l?p t?c n?u qu v? c?m th?y khng kh?e ho?c th?y tr?m tr?ng  h?n.   Thng tin ny khng nh?m m?c ?ch thay th? cho l?i khuyn m chuyn gia ch?m [place] s?c kh?e ni v?i qu v?. Hy b?o ??m qu v? ph?i th?o lu?n b?t k? v?n ?? g m qu v? c v?i chuyn gia ch?m [place] s?c kh?e c?a qu v?.   Document Released: 05/16/2005 Document Revised: 06/06/2014 Elsevier Interactive Patient Education [employer]   IF you received an x-ray today, you will receive an invoice from [facility]. Please contact [facility] at [phone] with questions or concerns regarding your invoice.   IF you received labwork today, you will receive an invoice from [employer]. Please contact [facility] at [phone] with questions or concerns regarding your invoice.   Our billing staff will not be able to assist you with questions regarding bills from these companies.  You will be contacted with the lab results as soon as they are available. The fastest way to get your results is to activate your My Chart account. Instructions are located on the last page of this paperwork. If you have not heard from [place] regarding the results in 2 weeks, please contact this office.        I personally performed the services described in this documentation, which was scribed in my presence. The recorded information has been reviewed and considered, and addended by me as needed.   Signed,   [name], MD [facility].  12/28/15 9:32 AM

## 2015-12-26 NOTE — Patient Instructions (Addendum)
start omeprazole 1 pill once per day. Avoid foods below that can make heartburn worse. I will check liver tests, and bacteria test that can cause ulcers.  Will also send you to stomach specialist.  If any worsening of your abdominal pain, return here or the emergency room.   Food Choices for Gastroesophageal Reflux Disease, Adult When you have gastroesophageal reflux disease (GERD), the foods you eat and your eating habits are very important. Choosing the right foods can help ease the discomfort of GERD. WHAT GENERAL GUIDELINES DO I NEED TO FOLLOW?  Choose fruits, vegetables, whole grains, low-fat dairy products, and low-fat meat, fish, and poultry.  Limit fats such as oils, salad dressings, butter, nuts, and avocado.  Keep a food diary to identify foods that cause symptoms.  Avoid foods that cause reflux. These may be different for different people.  Eat frequent small meals instead of three large meals each day.  Eat your meals slowly, in a relaxed setting.  Limit fried foods.  Cook foods using methods other than frying.  Avoid drinking alcohol.  Avoid drinking large amounts of liquids with your meals.  Avoid bending over or lying down until 2-3 hours after eating. WHAT FOODS ARE NOT RECOMMENDED? The following are some foods and drinks that may worsen your symptoms: Vegetables Tomatoes. Tomato juice. Tomato and spaghetti sauce. Chili peppers. Onion and garlic. Horseradish. Fruits Oranges, grapefruit, and lemon (fruit and juice). Meats High-fat meats, fish, and poultry. This includes hot dogs, ribs, ham, sausage, salami, and bacon. Dairy Whole milk and chocolate milk. Sour cream. Cream. Butter. Ice cream. Cream cheese.  Beverages Coffee and tea, with or without caffeine. Carbonated beverages or energy drinks. Condiments Hot sauce. Barbecue sauce.  Sweets/Desserts Chocolate and cocoa. Donuts. Peppermint and spearmint. Fats and Oils High-fat foods, including [place] fries  and potato chips. Other Vinegar. Strong spices, such as black pepper, white pepper, red pepper, cayenne, curry powder, cloves, ginger, and chili powder. The items listed above may not be a complete list of foods and beverages to avoid. Contact your dietitian for more information.   This information is not intended to replace advice given to you by your health care provider. Make sure you discuss any questions you have with your health care provider.   Document Released: 05/16/2005 Document Revised: 06/06/2014 Document Reviewed: 03/20/2013 Elsevier Interactive Patient Education 2016 Elsevier Inc.  ?au b?ng, Ng??i l?n (Abdominal Pain, Adult) C nhi?u nguyn nhn d?n ??n ?au b?ng. Thng th??ng ?au b?ng l do m?t b?nh gy ra v s? khng ?? n?u khng ?i?u tr?Marland Kitchen B?nh ny c th? ???c theo di v ?i?u tr? t?i nh. Chuyn gia ch?m [place] s?c kh?e s? ti?n hnh khm th?c th? v c th? yu c?u lm xt nghi?m mu v ch?p X quang ?? xc ??nh m?c ?? nghim tr?ng c?a c?n ?au b?ng. Tuy nhin, trong nhi?u tr??ng h?p, ph?i m?t nhi?u th?i gian h?n ?? xc ??nh r nguyn nhn gy ?au b?ng. Tr??c khi tm ra nguyn nhn, chuyn gia ch?m [place] s?c kh?e c th? khng bi?t li?u qu v? c c?n lm thm xt nghi?m ho?c ti?p t?c ?i?u tr? hay khng. H??NG D?N CH?M [place] T?I NH  Theo di c?n ?au b?ng xem c b?t k? thay ??i no khng. Nh?ng hnh ??ng sau c th? gip lo?i b? b?t c? c?m gic kh ch?u no qu v? ?ang b?.  Ch? s? d?ng thu?c khng c?n k ??n ho?c thu?c c?n k ??n theo ch? d?n c?a chuyn gia  ch?m [place] s?c kh?e.  Khng dng thu?c nhu?n trng tr? khi ???c chuyn gia ch?m [place] s?c kh?e ch? ??nh.  Th? dng m?t ch? ?? ?n l?ng (n??c lu?c th?t, tr, ho?c n??c) theo ch? d?n c?a chuyn gia ch?m [place] s?c kh?e. Chuy?n d?n sang m?t ch? ?? ?n nh? n?u ch?u ???c. ?I KHM N?U:  Qu v? b? ?au b?ng khng r nguyn nhn.  Qu v? b? ?au b?ng km theo bu?n nn ho?c tiu ch?y.  Qu v? b? ?au khi ?i ti?u ho?c ?i ngoi.  Qu v? b? c?n ?au b?ng  lm th?c gi?c vo ban ?m.  Qu v? b? ?au b?ng n?ng thm ho?c ?? h?n khi ?n.  Qu v? b? ?au b?ng n?ng thm khi ?n ?? ?n nhi?u ch?t bo.  Qu v? b? s?t. NGAY L?P T?C ?I KHM N?U:   C?n ?au khng kh?i trong vng 2 gi?Ladell Heads v? v?n ti?p t?c b? i (nn m?a).  Ch? c th? c?m th?y ?au ? m?t s? ph?n b?ng, ch?ng h?n nh? ? ph?n b?ng bn ph?i ho?c bn d??i tri.  Phn c?a qu v? c mu ho?c c mu ?en nh? h?c n. ??M B?O QU V?:  Hi?u r cc h??ng d?n ny.  S? theo di tnh tr?ng c?a mnh.  S? yu c?u tr? gip ngay l?p t?c n?u qu v? c?m th?y khng kh?e ho?c th?y tr?m tr?ng h?n.   Thng tin ny khng nh?m m?c ?ch thay th? cho l?i khuyn m chuyn gia ch?m [place] s?c kh?e ni v?i qu v?. Hy b?o ??m qu v? ph?i th?o lu?n b?t k? v?n ?? g m qu v? c v?i chuyn gia ch?m [place] s?c kh?e c?a qu v?.   Document Released: 05/16/2005 Document Revised: 06/06/2014 Elsevier Interactive Patient Education [employer]   IF you received an x-ray today, you will receive an invoice from [facility]. Please contact [facility] at [phone] with questions or concerns regarding your invoice.   IF you received labwork today, you will receive an invoice from [employer]. Please contact [facility] at [phone] with questions or concerns regarding your invoice.   Our billing staff will not be able to assist you with questions regarding bills from these companies.  You will be contacted with the lab results as soon as they are available. The fastest way to get your results is to activate your My Chart account. Instructions are located on the last page of this paperwork. If you have not heard from [place] regarding the results in 2 weeks, please contact this office.

## 2015-12-28 LAB — H. PYLORI BREATH TEST: H. pylori Breath Test: NOT DETECTED

## 2016-01-09 ENCOUNTER — Encounter: Payer: Self-pay | Admitting: *Deleted

## 2016-03-28 ENCOUNTER — Ambulatory Visit (INDEPENDENT_AMBULATORY_CARE_PROVIDER_SITE_OTHER): Payer: BLUE CROSS/BLUE SHIELD | Admitting: Family Medicine

## 2016-03-28 VITALS — BP 122/72 | HR 82 | Temp 98.4°F | Resp 17 | Ht 58.5 in | Wt 134.0 lb

## 2016-03-28 DIAGNOSIS — R946 Abnormal results of thyroid function studies: Secondary | ICD-10-CM | POA: Diagnosis not present

## 2016-03-28 DIAGNOSIS — J029 Acute pharyngitis, unspecified: Secondary | ICD-10-CM | POA: Diagnosis not present

## 2016-03-28 DIAGNOSIS — Z23 Encounter for immunization: Secondary | ICD-10-CM | POA: Diagnosis not present

## 2016-03-28 DIAGNOSIS — R7989 Other specified abnormal findings of blood chemistry: Secondary | ICD-10-CM

## 2016-03-28 DIAGNOSIS — R1310 Dysphagia, unspecified: Secondary | ICD-10-CM

## 2016-03-28 DIAGNOSIS — R12 Heartburn: Secondary | ICD-10-CM

## 2016-03-28 LAB — THYROID PANEL WITH TSH
FREE THYROXINE INDEX: 2.4 (ref 1.4–3.8)
T3 UPTAKE: 34 % (ref 22–35)
T4, Total: 7.2 ug/dL (ref 4.5–12.0)
TSH: 0.17 mIU/L — ABNORMAL LOW

## 2016-03-28 MED ORDER — RANITIDINE HCL 150 MG PO TABS: 150.0000 mg | ORAL_TABLET | Freq: Two times a day (BID) | ORAL | 1 refills | Status: DC

## 2016-03-28 NOTE — Patient Instructions (Addendum)
Ti ? gi?i thi?u b?n v?i chuyn gia [place] M?i, H?ng v C? h?ng ?? ?nh gi thm ch?ng ?au h?ng v kh nu?t. H? s? lin h? v?i b?n v? l?p k? ho?ch cu?c h?n c?a b?n.  Kh ch?u ? b?ng B?t ??u Ranitidine 150 mg hai l?n m?i ngy cho ??n khi cc tri?u ch?ng ???c c?i thi?n  Ti s? lin l?c v?i b?n v? k?t qu? phng th nghi?m c?a b?n.   I have referred you to the Ears, Nose, and Throat specialist to further evaluate sore throat and difficulty swallowing. They will contact you regarding scheduling your appointment.  Abdominal discomfort Start Ranitidine 150 mg twice daily until symptoms improve. I will contact you with lab results.     IF you received an x-ray today, you will receive an invoice from [facility]. Please contact [place] [facility] at [phone] with questions or concerns regarding your invoice.   IF you received labwork today, you will receive an invoice from [facility]. Please contact [facility] at [phone] with questions or concerns regarding your invoice.   Our billing staff will not be able to assist you with questions regarding bills from these companies.  You will be contacted with the lab results as soon as they are available. The fastest way to get your results is to activate your My Chart account. Instructions are located on the last page of this paperwork. If you have not heard from us regarding the results in 2 weeks, please contact this office.      

## 2016-03-28 NOTE — Progress Notes (Signed)
   Patient ID: Alexis Hickman, female    DOB: 06/27/1960, 55 y.o.   MRN: [id number]  PCP: No primary care provider on file.  Chief Complaint  Patient presents with  . Sore Throat  . Dysphagia  . Immunizations    flu     Subjective:  Family interpreting  Katie # [id number]  HPI 55 year old female,presents for evaluation of sore throat times 1 year. [facility]. Today she reports,difficulty swallowing meals and drink. She has been preciously treated for acid reflux symptoms and stopped taking medication as she felt it was not working. She feels the throat soreness is related to inhaling large amounts of dirt that has accumulated in her upper respiratory airway from frequent exposure at her place of employment. Cough and abdominal  mid-epigastric pain. After she eats and get really full she experiences pain. Denies mass or hardness in mid-epigastric region. Reports that spicy and hot foods burn her stomach. Reports regular bowel movement daily but feels that she may have an intestinal parasite infection. She is requesting anti parasitic medication treatment that they prescribe in [place].  Review of Systems See HPI  There are no active problems to display for this patient.   Prior to Admission medications   Medication Sig Start Date End Date Taking? Authorizing Provider  omeprazole (PRILOSEC) 20 MG capsule Take 1 capsule (20 mg total) by mouth daily. 12/26/15  Yes Shade FloodJeffrey R Greene, MD     Allergies  Allergen Reactions  . Advil [Ibuprofen]     Makes itchy when taking       Objective:  Physical Exam  Constitutional: She is oriented to person, place, and time. She appears well-developed and well-nourished.  HENT:  Head: Normocephalic and atraumatic.  Right Ear: External ear normal.  Left Ear: External ear normal.  Nose: Nose normal.  Mouth/Throat: Oropharynx is clear and moist.  Eyes: Conjunctivae are normal. Pupils are equal, round, and reactive to light.    Neck: Normal range of motion. Neck supple.  Cardiovascular: Normal rate, regular rhythm, normal heart sounds and intact distal pulses.   Pulmonary/Chest: Effort normal and breath sounds normal.  Abdominal: Soft. Bowel sounds are normal. She exhibits no distension and no mass. There is no rebound and no guarding.  Genitourinary:  Genitourinary Comments: Refused rectal exam and to provide stool sample.  Musculoskeletal: Normal range of motion.  Neurological: She is alert and oriented to person, place, and time.  Skin: Skin is warm and dry.  Psychiatric: She has a normal mood and affect. Her behavior is normal. Judgment and thought content normal.    Vitals:   03/28/16 1155  BP: 122/72  Pulse: 82  Resp: 17  Temp: 98.4 F (36.9 C)     Assessment & Plan:  1. Sore throat - Ambulatory referral to ENT - Thyroid Panel With TSH  2. Dysphagia, unspecified type - Ambulatory referral to ENT  3. Needs flu shot -Flu Vaccine QUAD 36+ mos IM  4. Heartburn Ranitidine 150 mg twice daily for abdominal discomfort. Patient refused rectal exam and to provide a stool specimen in order to further evaluate complaint of intestinal parasites.  5. Decreased thyroid stimulating hormone (TSH) level - Ambulatory referral to Endocrinology  Follow-up as needed.  Godfrey PickKimberly S. Tiburcio PeaHarris, MSN, FNP-C [facility]

## 2016-03-29 ENCOUNTER — Encounter: Payer: Self-pay | Admitting: Family Medicine

## 2016-04-05 ENCOUNTER — Other Ambulatory Visit: Payer: Self-pay

## 2016-04-05 MED ORDER — RANITIDINE HCL 150 MG PO TABS: 150.0000 mg | ORAL_TABLET | Freq: Two times a day (BID) | ORAL | 0 refills | Status: DC

## 2016-04-05 NOTE — Telephone Encounter (Signed)
02/2016 last ov and refill but needs 90 day supply

## 2016-04-15 ENCOUNTER — Encounter: Payer: Self-pay | Admitting: Endocrinology

## 2016-06-20 ENCOUNTER — Ambulatory Visit (INDEPENDENT_AMBULATORY_CARE_PROVIDER_SITE_OTHER): Payer: BLUE CROSS/BLUE SHIELD | Admitting: Family Medicine

## 2016-06-20 ENCOUNTER — Ambulatory Visit (INDEPENDENT_AMBULATORY_CARE_PROVIDER_SITE_OTHER): Payer: BLUE CROSS/BLUE SHIELD

## 2016-06-20 VITALS — BP 122/80 | HR 74 | Temp 98.1°F | Resp 18 | Ht 58.5 in | Wt 134.0 lb

## 2016-06-20 DIAGNOSIS — R109 Unspecified abdominal pain: Secondary | ICD-10-CM

## 2016-06-20 DIAGNOSIS — R10A2 Flank pain, left side: Secondary | ICD-10-CM | POA: Insufficient documentation

## 2016-06-20 MED ORDER — CYCLOBENZAPRINE HCL 10 MG PO TABS: 10.0000 mg | ORAL_TABLET | Freq: Three times a day (TID) | ORAL | 0 refills | Status: DC | PRN
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
## 2016-06-20 NOTE — Assessment & Plan Note (Addendum)
Likely having muscle spasm vs contusion of her ribs. X-rays were normal. She is having no problems breathing.  - flexeril  - advised to f/u if worse.

## 2016-06-20 NOTE — Patient Instructions (Addendum)
Thank you for coming in,   I will send in medication for your pain.   You can take it one time daily or three times a day depending on your pain.   Please let us know if your pain gets worse or changes.    Please feel free to call with any questions or concerns at any time, at [phone]. --Dr. Jordan LikesSchmitz     IF you received an x-ray today, you will receive an invoice from [facility]. Please contact [facility] at [phone] with questions or concerns regarding your invoice.   IF you received labwork today, you will receive an invoice from [facility]. Please contact [facility] at [phone] with questions or concerns regarding your invoice.   Our billing staff will not be able to assist you with questions regarding bills from these companies.  You will be contacted with the lab results as soon as they are available. The fastest way to get your results is to activate your My Chart account. Instructions are located on the last page of this paperwork. If you have not heard from us regarding the results in 2 weeks, please contact this office.

## 2016-12-13 ENCOUNTER — Encounter: Payer: Self-pay | Admitting: Physician Assistant

## 2016-12-13 ENCOUNTER — Ambulatory Visit (INDEPENDENT_AMBULATORY_CARE_PROVIDER_SITE_OTHER): Payer: BLUE CROSS/BLUE SHIELD | Admitting: Physician Assistant

## 2016-12-13 VITALS — BP 116/77 | HR 79 | Temp 98.6°F | Resp 16 | Ht 58.5 in | Wt 134.8 lb

## 2016-12-13 DIAGNOSIS — R0981 Nasal congestion: Secondary | ICD-10-CM | POA: Diagnosis not present

## 2016-12-13 DIAGNOSIS — J029 Acute pharyngitis, unspecified: Secondary | ICD-10-CM

## 2016-12-13 DIAGNOSIS — J309 Allergic rhinitis, unspecified: Secondary | ICD-10-CM

## 2016-12-13 MED ORDER — FLUTICASONE PROPIONATE 50 MCG/ACT NA SUSP: 2.0000 | Freq: Every day | NASAL | 6 refills | Status: DC

## 2016-12-13 MED ORDER — CETIRIZINE HCL 10 MG PO TABS: 10.0000 mg | ORAL_TABLET | Freq: Every day | ORAL | 11 refills | Status: DC

## 2016-12-13 NOTE — Progress Notes (Signed)
   Alexis MorganMok Hickman  MRN: [id number] DOB: 01/29/1961  PCP: Patient, No Pcp Per  Subjective:  Pt is a 56 year old female who presents to clinic for sore throat and headache.  Sore throat x 3 years. Occasional cough, runny nose and sneezing. She has not taken anything to feel better.  Denies fever, chills, shob, pain with swallowing  Review of Systems  Constitutional: Negative for chills, diaphoresis, fatigue and fever.  HENT: Positive for congestion, postnasal drip, sneezing and sore throat. Negative for rhinorrhea and sinus pressure.   Respiratory: Positive for cough. Negative for chest tightness, shortness of breath and wheezing.   Cardiovascular: Negative for chest pain and palpitations.  Gastrointestinal: Negative for abdominal pain, diarrhea, nausea and vomiting.  Neurological: Negative for weakness, light-headedness and headaches.    Patient Active Problem List   Diagnosis Date Noted  . Left flank pain 06/20/2016    No current outpatient prescriptions on file prior to visit.   No current facility-administered medications on file prior to visit.     Allergies  Allergen Reactions  . Advil [Ibuprofen]     Makes itchy when taking     Objective:  BP 116/77   Pulse 79   Temp 98.6 F (37 C) (Oral)   Resp 16   Ht 4' 10.5" (1.486 m)   Wt 134 lb 12.8 oz (61.1 kg)   SpO2 98%   BMI 27.69 kg/m   Physical Exam  Constitutional: She is oriented to person, place, and time and well-developed, well-nourished, and in no distress. No distress.  HENT:  Right Ear: Tympanic membrane normal.  Left Ear: Tympanic membrane normal.  Mouth/Throat: Mucous membranes are normal. Posterior oropharyngeal edema present.  Cardiovascular: Normal rate, regular rhythm and normal heart sounds.   Neurological: She is alert and oriented to person, place, and time. GCS score is 15.  Skin: Skin is warm and dry.  Psychiatric: Mood, memory, affect and judgment normal.  Vitals reviewed.   Assessment and Plan :   1. Sore throat 2. Nasal congestion 3. Allergic rhinitis, unspecified seasonality, unspecified trigger - cetirizine (ZYRTEC) 10 MG tablet; Take 1 tablet (10 mg total) by mouth daily.  Dispense: 30 tablet; Refill: 11 - fluticasone (FLONASE) 50 MCG/ACT nasal spray; Place 2 sprays into both nostrils daily.  Dispense: 16 g; Refill: 6 - Suspect allergies, will treat accordingly. Supportive care encouraged: hydration, warm tea with honey. RTC if no improvement. Consider GERD work-up.    Marco CollieWhitney Irisa Grimsley, PA-C  Primary Care at [facility] 12/13/2016 4:17 PM

## 2016-12-13 NOTE — Patient Instructions (Addendum)
Start taking Zyrtec every day.  Flonase - 2 sprays each nostril two times a day.  Drink 1-2 liters of water daily.  Warm tea with honey and lemon. Please see below for salt water gargle recipe.  Thank you for coming in today. I hope you feel we met your needs.  Feel free to call [facility] if you have any questions or further requests.  Please consider signing up for MyChart if you do not already have it, as this is a great way to communicate with me.  Best,  Whitney McVey, PA-C  Vim h?ng (Pharyngitis) Vim h?ng l tnh tr?ng t?y ??, ?au, v s?ng (vim) ho?ng c?a qu v?. NGUYN NHN. Vim h?ng th??ng do nhi?m trng gy ra. Nh?ng nhi?m trng ny ch? y?u do vi rt gy ra (do vi rt) v l m?t ph?n c?a b?nh c?m l?nh. Tuy nhin, ?i khi vim h?ng do vi khu?n gy ra (do vi khu?n). Vim h?ng c?ng c th? do d? ?ng gy ra. Vim h?ng do vi rt c th? ly lan t? ng??i sang ng??i thng qua ho, h?t h?i, v nh?ng v?t d?ng ho?c ?? dng c nhn (c?c, d?a, tha, bn ch?i ?nh r?ng). Vim h?ng do vi khu?n c th? ly lan t? ng??i sang ng??i thng qua ti?p xc g?n g?i h?n, ch?ng h?n nh? hn nhau. D?U HI?U V TRI?U CH?NG Cc tri?u ch?ng c?a vim h?ng bao g?m:  ?au h?ng.  M?t m?i (m?t)  S?t nh?.  ?au ??u.  ?au kh?p ho?c ?au c?.  Pht ban Database administrator.  S?ng h?ch b?ch huy?t.  M?t l?p mng trng gi?ng m?ng bm trong h?ng ho?c ami?an (th??ng nhn th?y ? b?nh vim h?ng do vi khu?n). CH?N ?ON Chuyn gia ch?m [place] s?c kh?e s? h?i qu v? v? b?nh v cc tri?u ch?ng c?a qu v?. Th??ng ch? c?n b?nh s? c?a qu v?, cng v?i khm th?c th? ?? c th? ch?n ?on vim h?ng. ?i khi c?n lm xt nghi?m nhanh tm lin c?u khu?n. C th? c?n lm cc xt nghi?m khc, ty thu?c vo nguyn nhn b? nghi ng?. ?I?U TR? Vim h?ng do vi rt th??ng s? ?? trong 3 -4 ngy m khng c?n dng thu?c. Vim h?ng do vi khu?n c th? ???c ?i?u tr? b?ng cc lo?i thu?c di?t vi trng (khng sinh). H??NG D?N CH?M [place] T?I NH  U?ng ?? n??c v ch?t l?ng  ?? n??c ti?u trong ho?c c mu vng nh?t.  Ch? s? d?ng thu?c khng c?n k ??n ho?c thu?c c?n k ??n theo ch? d?n c?a chuyn gia ch?m [place] s?c kh?e: ? N?u qu v? ???c k dng thu?c khng sinh, hy b?o ??m vi?c qu v? dng h?t thu?c ngay c? khi qu v? b?t ??u c?m th?y ?? h?n. ? Khngdng aspirin.  Ngh? ng?i nhi?u.  Xc mi?ng b?ng 8 ao-x? n??c mu?i ( tha c ph mu?i pha vo 1 lt (Anh) n??c) 1 - 2 ti?ng m?t l?n ?? lm d?u h?ng.  Vim ng?m vim h?ng (n?u qu v? khng c nguy c? b? ngh?t th?) ho?c thu?c x?t c th? ???c s? d?ng ?? lm d?u h?ng.  ?I KHM N?U:  Qu v? c c?c s?ng to v c c?m gic ?au ? c? qu v?.  Qu v? b? pht ban.  Qu v? ho ra ??m mu xanh l cy, nu vng, ho?c l?n mu.  NGAY L?P T?C ?I KHM N?U:  C? qu v? b? c?ng.  Qu v? b? ch?y  n??c di ho?c khng th? nu?t ch?t l?ng.  Qu v? nn m?a ho?c khng th? gi? thu?c ho?c ch?t l?ng trong c? th?.  Qu v? b? ?au nhi?u m khng kh?i sau khi s? d?ng thu?c theo khuy?n ngh?Sander Nephew v? b? kh th? (khng ph?i l do ng?t m?i).  ??M B?O QU V?:  Hi?u r cc h??ng d?n ny.  S? theo di tnh tr?ng c?a mnh.  S? yu c?u tr? gip ngay l?p t?c n?u qu v? c?m th?y khng kh?e ho?c th?y tr?m tr?ng h?n.  Thng tin ny khng nh?m m?c ?ch thay th? cho l?i khuyn m chuyn gia ch?m [place] s?c kh?e ni v?i qu v?. Hy b?o ??m qu v? ph?i th?o lu?n b?t k? v?n ?? g m qu v? c v?i chuyn gia ch?m [place] s?c kh?e c?a qu v?. Document Released: 05/16/2005 Document Revised: 03/06/2013 Document Reviewed: 01/21/2013 Elsevier Interactive Patient Education  2017 [place].   IF you received an x-ray today, you will receive an invoice from [facility]. Please contact [facility] at [phone] with questions or concerns regarding your invoice.   IF you received labwork today, you will receive an invoice from [place]. Please contact [facility] at [phone] with questions or concerns regarding your invoice.   Our billing  staff will not be able to assist you with questions regarding bills from these companies.  You will be contacted with the lab results as soon as they are available. The fastest way to get your results is to activate your My Chart account. Instructions are located on the last page of this paperwork. If you have not heard from [place] regarding the results in 2 weeks, please contact this office.

## 2017-08-15 ENCOUNTER — Ambulatory Visit: Payer: BLUE CROSS/BLUE SHIELD | Admitting: Urgent Care

## 2017-08-15 ENCOUNTER — Ambulatory Visit (INDEPENDENT_AMBULATORY_CARE_PROVIDER_SITE_OTHER): Payer: BLUE CROSS/BLUE SHIELD

## 2017-08-15 ENCOUNTER — Encounter: Payer: Self-pay | Admitting: Urgent Care

## 2017-08-15 VITALS — BP 134/86 | HR 76 | Temp 97.7°F | Resp 16 | Ht 58.5 in | Wt 135.0 lb

## 2017-08-15 DIAGNOSIS — R05 Cough: Secondary | ICD-10-CM

## 2017-08-15 DIAGNOSIS — R0981 Nasal congestion: Secondary | ICD-10-CM | POA: Diagnosis not present

## 2017-08-15 DIAGNOSIS — R0982 Postnasal drip: Secondary | ICD-10-CM | POA: Diagnosis not present

## 2017-08-15 DIAGNOSIS — R058 Other specified cough: Secondary | ICD-10-CM

## 2017-08-15 DIAGNOSIS — Z9109 Other allergy status, other than to drugs and biological substances: Secondary | ICD-10-CM | POA: Diagnosis not present

## 2017-08-15 DIAGNOSIS — L299 Pruritus, unspecified: Secondary | ICD-10-CM | POA: Diagnosis not present

## 2017-08-15 DIAGNOSIS — R51 Headache: Secondary | ICD-10-CM

## 2017-08-15 DIAGNOSIS — J029 Acute pharyngitis, unspecified: Secondary | ICD-10-CM | POA: Diagnosis not present

## 2017-08-15 DIAGNOSIS — R519 Headache, unspecified: Secondary | ICD-10-CM

## 2017-08-15 LAB — POCT URINALYSIS DIP (MANUAL ENTRY)
BILIRUBIN UA: NEGATIVE
Glucose, UA: NEGATIVE mg/dL
Ketones, POC UA: NEGATIVE mg/dL
Leukocytes, UA: NEGATIVE
NITRITE UA: NEGATIVE
PH UA: 5.5 (ref 5.0–8.0)
PROTEIN UA: NEGATIVE mg/dL
RBC UA: NEGATIVE
Spec Grav, UA: <=1.005 — AB (ref 1.010–1.025)
UROBILINOGEN UA: 0.2 U/dL

## 2017-08-15 LAB — BASIC METABOLIC PANEL
BUN / CREAT RATIO: 19 (ref 9–23)
BUN: 15 mg/dL (ref 6–24)
CHLORIDE: 102 mmol/L (ref 96–106)
CO2: 24 mmol/L (ref 20–29)
Calcium: 9.4 mg/dL (ref 8.7–10.2)
Creatinine, Ser: 0.79 mg/dL (ref 0.57–1.00)
GFR calc Af Amer: 95 mL/min/{1.73_m2} (ref >59)
GFR calc non Af Amer: 82 mL/min/{1.73_m2} (ref >59)
GLUCOSE: 95 mg/dL (ref 65–99)
Potassium: 4.4 mmol/L (ref 3.5–5.2)
SODIUM: 141 mmol/L (ref 134–144)

## 2017-08-15 LAB — CBC
Hematocrit: 41.8 % (ref 34.0–46.6)
Hemoglobin: 13.9 g/dL (ref 11.1–15.9)
MCH: 30 pg (ref 26.6–33.0)
MCHC: 33.3 g/dL (ref 31.5–35.7)
MCV: 90 fL (ref 79–97)
PLATELETS: 249 10*3/uL (ref 150–379)
RBC: 4.64 x10E6/uL (ref 3.77–5.28)
RDW: 14.1 % (ref 12.3–15.4)
WBC: 5.8 10*3/uL (ref 3.4–10.8)

## 2017-08-15 MED ORDER — FLUTICASONE PROPIONATE 50 MCG/ACT NA SUSP: 2.0000 | Freq: Every day | NASAL | 11 refills | Status: DC

## 2017-08-15 MED ORDER — CETIRIZINE HCL 10 MG PO TABS: 10.0000 mg | ORAL_TABLET | Freq: Every day | ORAL | 3 refills | Status: DC

## 2017-08-15 MED ORDER — MONTELUKAST SODIUM 10 MG PO TABS: 10.0000 mg | ORAL_TABLET | Freq: Every day | ORAL | 1 refills | Status: DC

## 2017-08-15 MED ORDER — ALBUTEROL SULFATE HFA 108 (90 BASE) MCG/ACT IN AERS: 2.0000 | INHALATION_SPRAY | Freq: Four times a day (QID) | RESPIRATORY_TRACT | 1 refills | Status: DC | PRN

## 2017-08-15 NOTE — Patient Instructions (Addendum)
Vim m?i d? ?ng, Ng??i l?n Allergic Rhinitis, Adult Vim m?i d? ?ng l m?t ph?n ?ng d? ?ng ?nh h??ng ??n nim m?c bn trong m?i. Tnh tr?ng ny gy h?t h?i, ch?y n??c m?i ho?c ngh?t m?i v c?m th?y d?ch nh?y ?i xu?ng thnh sau h?ng (ch?y n??c m?i sau). Vim m?i d? ?ng c th? t? nh? ??n n?ng. C hai lo?i vim m?i d? ?ng:  Theo ma. Lo?i ny c?ng ???c g?i l s?t ma c? kh. N ch? x?y ra trong nh?ng ma nh?t ??nh.  Quanh n?m. Lo?i vim m?i d? ?ng ny x?y ra vo b?t k? th?i ?i?m no trong n?m.  Nguyn nhn g gy ra? Tnh tr?ng ny x?y ra khi h? th?ng b?o v? c? th? (h? mi?n d?ch) ph?n ?ng v?i m?t s? ch?t v h?i nh?t ??nh ???c g?i l cc ch?t gy d? ?ng nh? th? cc ch?t ny l m?m b?nh.  Vim m?i d? ?ng theo ma do ph?n hoa gy ra, ph?n hoa c th? ??n t? c?, cy v c? d?i. Vim m?i d? ?ng quanh n?m c th? l do:  M?t b?i trong nh.  V?y da v?t nui.  Bo t? n?m m?c.  Cc d?u hi?u ho?c tri?u ch?ng l g? Nh?ng tri?u ch?ng c?a tnh tr?ng ny bao g?m:  H?t h?i.  Ch?y n??c m?i ho?c ng?t m?i (ngh?t m?i).  S? mu?i pha sau.  Ng?a m?i.  Ch?y n??c m?t.  Kh ng?Marland Kitchen  Bu?n ng? vo ban ngy.  Ch?n ?on tnh tr?ng ny nh? th? no? Tnh tr?ng ny c th? ???c ch?n ?on d?a vo:  B?nh s? c?a qu v?.  Khm th?c th?.  Xt nghi?m ?? ki?m tra cc tnh tr?ng lin quan, ch?ng h?n nh?: ? Hen suy?n. ? B?nh ?au m?t ??. ? Nhi?m trng tai. ? Nhi?m trng ???ng h h?p trn.  Xt nghi?m ?? tm ra ch?t gy d? ?ng no gy ra cc tri?u ch?ng c?a qu v?. Cc xt nghi?m ny c th? bao g?m xt nghi?m mu ho?c xt nghi?m trn da.  Tnh tr?ng ny ???c ?i?u tr? nh? th? no? Khng c cch ?i?u tr? kh?i tnh tr?ng ny, nh?ng vi?c ?i?u tr? c th? gip ki?m sot cc tri?u ch?ng. ?i?u tr? c th? bao g?m:  Dng thu?c ?? ng?n ch?n cc tri?u ch?ng d? ?ng, ch?ng h?n nh? thu?c khng histamine. C th? dng thu?c d??i d?ng thu?c tim, thu?c x?t m?i, ho?c thu?c d?ng vin u?ng.  Hessie Diener ch?t gy d? ?ng.  Kh? nh?y.  Ph??ng php ?i?u tr? ny lin quan ??n vi?c tim lin t?c cho ??n khi c? th? c?a qu v? tr? ln t nh?y c?m h?n v?i ch?t gy d? ?ng. C th? th?c hi?n ph??ng php ?i?u tr? ny n?u cc ph??ng php ?i?u tr? khc khng c tc d?ng.  N?u dng thu?c v trnh ch?t gy d? ?ng khng c tc d?ng, c th? cc lo?i thu?c m?i, m?nh h?n s? ???c k ??n.  Tun th? nh?ng h??ng d?n ny ? nh:  Tm hi?u xem qu v? b? d? ?ng v?i ci g. Cc ch?t gy d? ?ng ph? bi?n l khi, b?i v ph?n hoa.  Trnh nh?ng th? qu v? b? d? ?ng. ?y l m?t s? vi?c qu v? c th? lm ?? gip trnh cc ch?t gy d? ?ng: ? Thay th? th?m b?ng sn g?, ? lt ho?c nh?a vinyl. Th?m c th? dnh v?y da v b?i. ? Khng ht thu?c. Khng cho php ht  thu?c trong nh qu v?. ? Thay b? l?c l s??i v ?i?u ho khng kh t nh?t l m?i thng m?t l?n. ? Trong ma d? ?ng:  ?ng c?a s? cng nhi?u cng t?t.  Ln k? ho?ch cho cc ho?t ??ng ngoi tr?i khi l??ng ph?n hoa ? m?c th?p nh?t. Th?i ?i?m ny th??ng l trong cc gi? vo bu?i t?i.  Khi vo trong nh, hy thay qu?n o v t?m r?a tr??c khi ng?i ln gh? ho?c ga tr?i gi??ng.  Ch? s? d?ng thu?c khng k ??n v thu?c k ??n theo ch? d?n c?a chuyn gia ch?m [place] s?c kh?e.  Tun th? t?t c? cc l?n khm theo di theo ch? d?n c?a chuyn gia ch?m [place] s?c kh?e. ?i?u ny c vai tr quan tr?ng. Hy lin l?c v?i chuyn gia ch?m [place] s?c kh?e n?u:  Qu v? b? s?t.  Qu v? b? ho dai d?ng.  Qu v? t?o ra ti?ng so khi th? (qu v? th? kh kh).  Cc tri?u ch?ng gy tr? ng?i cho cc sinh ho?t th??ng ngy c?a qu v?. Yu c?u tr? gip ngay l?p t?c n?u:  Qu v? b? kh th?. Tm t?t  Tnh tr?ng ny c th? x? tr ???c b?ng cch dng cc lo?i thu?c theo ch? d?n v trnh cc ch?t gy d? ?ng.  Hy lin l?c v?i chuyn gia ch?m [place] s?c kh?e c?a qu v? n?u qu v? b? ho ho?c s?t dai d?ng.  Trong ma d? ?ng, hy ?ng c?a s? cng nhi?u cng t?t. Thng tin ny khng nh?m m?c ?ch thay th? cho l?i khuyn m chuyn gia  ch?m [place] s?c kh?e ni v?i qu v?. Hy b?o ??m qu v? ph?i th?o lu?n b?t k? v?n ?? g m qu v? c v?i chuyn gia ch?m  s?c kh?e c?a qu v?. Document Released: 09/07/2015 Document Revised: 08/29/2016 Document Reviewed: 08/29/2016 Elsevier Interactive Patient Education  2018 [employer].     IF you received an x-ray today, you will receive an invoice from [facility]. Please contact [facility] at [phone] with questions or concerns regarding your invoice.   IF you received labwork today, you will receive an invoice from [place]. Please contact [facility] at [phone] with questions or concerns regarding your invoice.   Our billing staff will not be able to assist you with questions regarding bills from these companies.  You will be contacted with the lab results as soon as they are available. The fastest way to get your results is to activate your My Chart account. Instructions are located on the last page of this paperwork. If you have not heard from [place] regarding the results in 2 weeks, please contact this office.

## 2017-08-15 NOTE — Progress Notes (Signed)
   MRN: [id number] DOB: 09/19/1960  Subjective:   Alexis Hickman is a 57 y.o. female presenting for 3 day history of sore throat, headaches, respiratory irritation, persistent dry cough. Patient works with Sales executivefolding towels and laundry, states that the smell of oils, odors and dust bother her throat and make it difficult for her to breathe. Denies fever, chest pain, shob, n/v, abdominal pain. Denies smoking cigarettes or drinking alcohol.   Alexis Hickman  Also is allergic to advil [ibuprofen].  Alexis Hickman  has a past medical history of Allergy. Denies past surgical history.  Objective:   Vitals: BP 134/86   Pulse 76   Temp 97.7 F (36.5 C) (Oral)   Resp 16   Ht 4' 10.5" (1.486 m)   Wt 135 lb (61.2 kg)   SpO2 97%   BMI 27.73 kg/m   Physical Exam  Constitutional: She is oriented to person, place, and time. She appears well-developed and well-nourished.  HENT:  Right Ear: Tympanic membrane normal.  Left Ear: Tympanic membrane normal.  Nasal turbinates boggy and dry. Post-nasal drainage present in oropharynx.  Eyes: No scleral icterus.  Neck: Normal range of motion. Neck supple.  Cardiovascular: Normal rate, regular rhythm and intact distal pulses. Exam reveals no gallop and no friction rub.  No murmur heard. Pulmonary/Chest: No respiratory distress. She has no wheezes. She has no rales.  Lymphadenopathy:    She has no cervical adenopathy.  Neurological: She is alert and oriented to person, place, and time.  Skin: Skin is warm and dry.   Results for orders placed or performed in visit on 08/15/17 (from the past 24 hour(s))  POCT urinalysis dipstick     Status: Abnormal   Collection Time: 08/15/17 10:58 AM  Result Value Ref Range   Color, UA yellow yellow   Clarity, UA clear clear   Glucose, UA negative negative mg/dL   Bilirubin, UA negative negative   Ketones, POC UA negative negative mg/dL   Spec Grav, UA <=4.098<=1.005 (A) 1.010 - 1.025   Blood, UA negative negative   pH, UA 5.5 5.0 - 8.0   Protein Ur,  POC negative negative mg/dL   Urobilinogen, UA 0.2 0.2 or 1.0 E.U./dL   Nitrite, UA Negative Negative   Leukocytes, UA Negative Negative   Dg Chest 2 View  Result Date: 08/15/2017 CLINICAL DATA:  Cough and difficulty breathing EXAM: CHEST - 2 VIEW COMPARISON:  06/20/2016 FINDINGS: The heart size and mediastinal contours are within normal limits. Both lungs are clear. The visualized skeletal structures are unremarkable. IMPRESSION: No active cardiopulmonary disease. Electronically Signed   By: Alcide CleverMark  Lukens M.D.   On: 08/15/2017 11:27    Assessment and Plan :   Environmental allergies  Acute nonintractable headache, unspecified headache type - Plan: POCT urinalysis dipstick, CBC, Basic metabolic panel  Post-nasal drainage  Sinus congestion  Sore throat  Dry cough - Plan: DG Chest 2 View  Itching  Will start allergy management with Zyrtec, Flonase. Use Singulair, albuterol for respiratory symptoms. Wrote work note for patient to be assigned elsewhere for her breathing. If this does not work, [facility]. Return-to-clinic precautions discussed, patient verbalized understanding.   Alexis BambergMario Kendall Justo, PA-C Primary Care at [facility] [phone] 08/15/2017  10:20 AM

## 2017-08-19 ENCOUNTER — Encounter: Payer: Self-pay | Admitting: Urgent Care
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
## 2019-06-07 ENCOUNTER — Encounter (HOSPITAL_COMMUNITY): Payer: Self-pay

## 2019-06-07 ENCOUNTER — Inpatient Hospital Stay (HOSPITAL_COMMUNITY)
Admission: EM | Admit: 2019-06-07 | Discharge: 2019-06-13 | DRG: 177 | Disposition: A | Discharge status: Home / Self Care | Payer: BC Managed Care – PPO | Attending: Internal Medicine | Admitting: Internal Medicine

## 2019-06-07 ENCOUNTER — Other Ambulatory Visit: Payer: Self-pay

## 2019-06-07 ENCOUNTER — Emergency Department (HOSPITAL_COMMUNITY): Payer: BC Managed Care – PPO

## 2019-06-07 DIAGNOSIS — Z9981 Dependence on supplemental oxygen: Secondary | ICD-10-CM | POA: Diagnosis not present

## 2019-06-07 DIAGNOSIS — R0902 Hypoxemia: Secondary | ICD-10-CM | POA: Diagnosis present

## 2019-06-07 DIAGNOSIS — Z79899 Other long term (current) drug therapy: Secondary | ICD-10-CM | POA: Diagnosis not present

## 2019-06-07 DIAGNOSIS — J1282 Pneumonia due to coronavirus disease 2019: Secondary | ICD-10-CM | POA: Diagnosis present

## 2019-06-07 DIAGNOSIS — J9601 Acute respiratory failure with hypoxia: Secondary | ICD-10-CM | POA: Diagnosis present

## 2019-06-07 DIAGNOSIS — Z886 Allergy status to analgesic agent status: Secondary | ICD-10-CM | POA: Diagnosis not present

## 2019-06-07 DIAGNOSIS — U071 COVID-19: Secondary | ICD-10-CM | POA: Diagnosis not present

## 2019-06-07 LAB — COMPREHENSIVE METABOLIC PANEL
ALT: 36 U/L (ref 0–44)
AST: 50 U/L — ABNORMAL HIGH (ref 15–41)
Albumin: 2.9 g/dL — ABNORMAL LOW (ref 3.5–5.0)
Alkaline Phosphatase: 51 U/L (ref 38–126)
Anion gap: 11 (ref 5–15)
BUN: 21 mg/dL (ref 8–23)
CO2: 26 mmol/L (ref 22–32)
Calcium: 7.7 mg/dL — ABNORMAL LOW (ref 8.9–10.3)
Chloride: 98 mmol/L (ref 98–111)
Creatinine, Ser: 0.69 mg/dL (ref 0.44–1.00)
GFR calc Af Amer: >60 mL/min (ref >60)
GFR calc non Af Amer: >60 mL/min (ref >60)
Glucose, Bld: 123 mg/dL — ABNORMAL HIGH (ref 70–99)
Potassium: 3.1 mmol/L — ABNORMAL LOW (ref 3.5–5.1)
Sodium: 135 mmol/L (ref 135–145)
Total Bilirubin: 1.2 mg/dL (ref 0.3–1.2)
Total Protein: 6.9 g/dL (ref 6.5–8.1)

## 2019-06-07 LAB — CBC WITH DIFFERENTIAL/PLATELET
Abs Immature Granulocytes: 0.09 10*3/uL — ABNORMAL HIGH (ref 0.00–0.07)
Basophils Absolute: 0 10*3/uL (ref 0.0–0.1)
Basophils Relative: 0 %
Eosinophils Absolute: 0 10*3/uL (ref 0.0–0.5)
Eosinophils Relative: 0 %
HCT: 40.4 % (ref 36.0–46.0)
Hemoglobin: 13.2 g/dL (ref 12.0–15.0)
Immature Granulocytes: 2 %
Lymphocytes Relative: 22 %
Lymphs Abs: 1.2 10*3/uL (ref 0.7–4.0)
MCH: 29.8 pg (ref 26.0–34.0)
MCHC: 32.7 g/dL (ref 30.0–36.0)
MCV: 91.2 fL (ref 80.0–100.0)
Monocytes Absolute: 0.5 10*3/uL (ref 0.1–1.0)
Monocytes Relative: 9 %
Neutro Abs: 3.7 10*3/uL (ref 1.7–7.7)
Neutrophils Relative %: 67 %
Platelets: 199 10*3/uL (ref 150–400)
RBC: 4.43 MIL/uL (ref 3.87–5.11)
RDW: 13.3 % (ref 11.5–15.5)
WBC: 5.4 10*3/uL (ref 4.0–10.5)
nRBC: 0 % (ref 0.0–0.2)

## 2019-06-07 LAB — POC SARS CORONAVIRUS 2 AG -  ED: SARS Coronavirus 2 Ag: POSITIVE — AB

## 2019-06-07 LAB — TRIGLYCERIDES: Triglycerides: 147 mg/dL (ref <150)

## 2019-06-07 LAB — HIV ANTIBODY (ROUTINE TESTING W REFLEX): HIV Screen 4th Generation wRfx: NONREACTIVE

## 2019-06-07 LAB — PROCALCITONIN: Procalcitonin: 0.13 ng/mL

## 2019-06-07 LAB — FIBRINOGEN: Fibrinogen: 682 mg/dL — ABNORMAL HIGH (ref 210–475)

## 2019-06-07 LAB — FERRITIN: Ferritin: 720 ng/mL — ABNORMAL HIGH (ref 11–307)

## 2019-06-07 LAB — D-DIMER, QUANTITATIVE: D-Dimer, Quant: 1.33 ug/mL-FEU — ABNORMAL HIGH (ref 0.00–0.50)

## 2019-06-07 LAB — LACTATE DEHYDROGENASE: LDH: 540 U/L — ABNORMAL HIGH (ref 98–192)

## 2019-06-07 LAB — LACTIC ACID, PLASMA: Lactic Acid, Venous: 1.5 mmol/L (ref 0.5–1.9)

## 2019-06-07 LAB — C-REACTIVE PROTEIN: CRP: 5.3 mg/dL — ABNORMAL HIGH (ref <1.0)

## 2019-06-07 LAB — TROPONIN I (HIGH SENSITIVITY): Troponin I (High Sensitivity): 8 ng/L (ref <18)

## 2019-06-07 MED ORDER — DEXAMETHASONE SODIUM PHOSPHATE 10 MG/ML IJ SOLN
6.0000 mg | INTRAMUSCULAR | Status: DC
  Administered 2019-06-08 – 2019-06-12 (×5): 6 mg via INTRAVENOUS
  Filled 2019-06-07 (×6): qty 1

## 2019-06-07 MED ORDER — ENOXAPARIN SODIUM 40 MG/0.4ML ~~LOC~~ SOLN
40.0000 mg | SUBCUTANEOUS | Status: DC
  Administered 2019-06-08 – 2019-06-12 (×6): 40 mg via SUBCUTANEOUS
  Filled 2019-06-07 (×6): qty 0.4

## 2019-06-07 MED ORDER — SODIUM CHLORIDE 0.9% FLUSH
3.0000 mL | Freq: Two times a day (BID) | INTRAVENOUS | Status: DC
  Administered 2019-06-08 – 2019-06-13 (×11): 3 mL via INTRAVENOUS

## 2019-06-07 MED ORDER — ONDANSETRON HCL 4 MG PO TABS: 4.0000 mg | ORAL_TABLET | Freq: Four times a day (QID) | ORAL | Status: DC | PRN

## 2019-06-07 MED ORDER — OXYCODONE HCL 5 MG PO TABS: 5.0000 mg | ORAL_TABLET | ORAL | Status: DC | PRN

## 2019-06-07 MED ORDER — POLYETHYLENE GLYCOL 3350 17 G PO PACK: 17.0000 g | PACK | Freq: Every day | ORAL | Status: DC | PRN

## 2019-06-07 MED ORDER — MONTELUKAST SODIUM 10 MG PO TABS: 10.0000 mg | ORAL_TABLET | Freq: Every day | ORAL | Status: DC

## 2019-06-07 MED ORDER — SODIUM CHLORIDE 0.9% FLUSH: 3.0000 mL | INTRAVENOUS | Status: DC | PRN

## 2019-06-07 MED ORDER — ACETAMINOPHEN 325 MG PO TABS
650.0000 mg | ORAL_TABLET | Freq: Four times a day (QID) | ORAL | Status: DC | PRN
  Administered 2019-06-09: 17:00:00 650 mg via ORAL
  Filled 2019-06-07 (×2): qty 2

## 2019-06-07 MED ORDER — DEXAMETHASONE SODIUM PHOSPHATE 10 MG/ML IJ SOLN
8.0000 mg | Freq: Once | INTRAMUSCULAR | Status: AC
  Administered 2019-06-07: 8 mg via INTRAVENOUS
  Filled 2019-06-07: qty 1

## 2019-06-07 MED ORDER — GUAIFENESIN-DM 100-10 MG/5ML PO SYRP: 10.0000 mL | ORAL_SOLUTION | ORAL | Status: DC | PRN

## 2019-06-07 MED ORDER — SODIUM CHLORIDE 0.9 % IV SOLN
100.0000 mg | Freq: Every day | INTRAVENOUS | Status: AC
  Administered 2019-06-08 – 2019-06-11 (×4): 100 mg via INTRAVENOUS
  Filled 2019-06-07 (×5): qty 20

## 2019-06-07 MED ORDER — BISACODYL 5 MG PO TBEC: 5.0000 mg | DELAYED_RELEASE_TABLET | Freq: Every day | ORAL | Status: DC | PRN

## 2019-06-07 MED ORDER — ALBUTEROL SULFATE HFA 108 (90 BASE) MCG/ACT IN AERS
2.0000 | INHALATION_SPRAY | RESPIRATORY_TRACT | Status: DC | PRN
  Filled 2019-06-07: qty 6.7

## 2019-06-07 MED ORDER — LORATADINE 10 MG PO TABS: 10.0000 mg | ORAL_TABLET | Freq: Every day | ORAL | Status: DC

## 2019-06-07 MED ORDER — SODIUM CHLORIDE 0.9 % IV SOLN
200.0000 mg | Freq: Once | INTRAVENOUS | Status: AC
  Administered 2019-06-07: 200 mg via INTRAVENOUS
  Filled 2019-06-07: qty 40
  Filled 2019-06-07: qty 200

## 2019-06-07 MED ORDER — FLUTICASONE PROPIONATE 50 MCG/ACT NA SUSP: 2.0000 | Freq: Every day | NASAL | Status: DC

## 2019-06-07 MED ORDER — FLEET ENEMA 7-19 GM/118ML RE ENEM: 1.0000 | ENEMA | Freq: Once | RECTAL | Status: DC | PRN

## 2019-06-07 MED ORDER — POTASSIUM CHLORIDE CRYS ER 20 MEQ PO TBCR
40.0000 meq | EXTENDED_RELEASE_TABLET | Freq: Once | ORAL | Status: AC
  Administered 2019-06-07: 40 meq via ORAL
  Filled 2019-06-07: qty 2

## 2019-06-07 MED ORDER — ONDANSETRON HCL 4 MG/2ML IJ SOLN
4.0000 mg | Freq: Four times a day (QID) | INTRAMUSCULAR | Status: DC | PRN
  Administered 2019-06-08: 4 mg via INTRAVENOUS
  Filled 2019-06-07: qty 2

## 2019-06-07 MED ORDER — SODIUM CHLORIDE 0.9 % IV SOLN: 250.0000 mL | INTRAVENOUS | Status: DC | PRN

## 2019-06-07 MED ORDER — SODIUM CHLORIDE 0.9% FLUSH
3.0000 mL | Freq: Two times a day (BID) | INTRAVENOUS | Status: DC
  Administered 2019-06-07 – 2019-06-12 (×6): 3 mL via INTRAVENOUS

## 2019-06-07 MED ORDER — HYDROCOD POLST-CPM POLST ER 10-8 MG/5ML PO SUER: 5.0000 mL | Freq: Two times a day (BID) | ORAL | Status: DC | PRN

## 2019-06-07 NOTE — ED Notes (Signed)
Attempted to call report to [facility]. No answer at this time.

## 2019-06-07 NOTE — ED Notes (Signed)
Son's phone  Vien, [phone] call with updates.

## 2019-06-07 NOTE — ED Notes (Signed)
[facility] here for pt transport to [facility]

## 2019-06-07 NOTE — ED Notes (Signed)
Attempted to call report to [facility] ([phone]) again. No answer.

## 2019-06-07 NOTE — H&P (Signed)
History and Physical    Alexis Hickman XKG:[id number] DOB: 1961-05-09 DOA: 06/07/2019  PCP: Patient, No Pcp Per Consultants:  None Patient coming from:  Home; NOK: Daughter, Ellasyn Swilling, [phone] - this number does not appear to work and patient reports not having numbers available at this time; I also tried the number for her friend that was listed in the chart and the patient's home number (a 27 man answered and said that I had the wrong number)   Chief Complaint: SOB  HPI: Alexis Hickman is a 59 y.o. female with no significant medical history presenting with worsening COVID-19 symptoms.  Patient tested positive for COVID-19 on 12/29.  Patient's son also has COVID and she reports that he gave it to her.  She developed weakness and fatigue and tested positive about 10 days ago.  Over the last day or so, she has had progressive SOB.  She is currently on 15L NRB O2 with normal O2 sats and is primarily concerning of hunger.  History was obtained through the teleinterpreter.   ED Course:  COVID.  No medical problems.  Speaks only [place].  Symptoms slightly more than a week.  SOB and weakness.  On NRB, was 60s on RA.  Likely needs Progressive at [facility].  Full code confirmed.  Review of Systems: As per HPI; otherwise review of systems reviewed and negative. Limited by language barrier.  Ambulatory Status:  Ambulates without assistance  Past Medical History:  Diagnosis Date  . Allergy     History reviewed. No pertinent surgical history.  Social History   Socioeconomic History  . Marital status: Married    Spouse name: Not on file  . Number of children: Not on file  . Years of education: Not on file  . Highest education level: Not on file  Occupational History  . Not on file  Tobacco Use  . Smoking status: Never Smoker  . Smokeless tobacco: Never Used  Substance and Sexual Activity  . Alcohol use: No    Alcohol/week: 0.0 standard drinks  . Drug use: No  . Sexual activity: Not on  file  Other Topics Concern  . Not on file  Social History Narrative  . Not on file   Social Determinants of Health   Financial Resource Strain:   . Difficulty of Paying Living Expenses: Not on file  Food Insecurity:   . Worried About Charity fundraiser in the Last Year: Not on file  . Ran Out of Food in the Last Year: Not on file  Transportation Needs:   . Lack of Transportation (Medical): Not on file  . Lack of Transportation (Non-Medical): Not on file  Physical Activity:   . Days of Exercise per Week: Not on file  . Minutes of Exercise per Session: Not on file  Stress:   . Feeling of Stress : Not on file  Social Connections:   . Frequency of Communication with Friends and Family: Not on file  . Frequency of Social Gatherings with Friends and Family: Not on file  . Attends Religious Services: Not on file  . Active Member of Clubs or Organizations: Not on file  . Attends Archivist Meetings: Not on file  . Marital Status: Not on file  Intimate Partner Violence:   . Fear of Current or Ex-Partner: Not on file  . Emotionally Abused: Not on file  . Physically Abused: Not on file  . Sexually Abused: Not on file    Allergies  Allergen Reactions  .  Advil [Ibuprofen]     Makes itchy when taking    History reviewed. No pertinent family history.  Prior to Admission medications   Medication Sig Start Date End Date Taking? Authorizing Provider  albuterol (PROVENTIL HFA;VENTOLIN HFA) 108 (90 Base) MCG/ACT inhaler Inhale 2 puffs into the lungs every 6 (six) hours as needed. 08/15/17   Wallis Bamberg, PA-C  cetirizine (ZYRTEC) 10 MG tablet Take 1 tablet (10 mg total) by mouth daily. 08/15/17   Wallis Bamberg, PA-C  fluticasone (FLONASE) 50 MCG/ACT nasal spray Place 2 sprays into both nostrils daily. 08/15/17   Wallis Bamberg, PA-C  montelukast (SINGULAIR) 10 MG tablet Take 1 tablet (10 mg total) by mouth at bedtime. 08/15/17   Wallis Bamberg, PA-C    Physical Exam: Vitals:   06/07/19  1005 06/07/19 1110 06/07/19 1200 06/07/19 1330  BP: 117/75 113/80 118/82 124/82  Pulse: 88 89 78 96  Resp: (!) 24 20 20 19   Temp: 98.1 F (36.7 C)     TempSrc: Oral     SpO2: 93% 90% 91% 92%     . General:  Appears calm and comfortable and is NAD . Eyes:  PERRL, EOMI, normal lids, iris . ENT:  grossly normal hearing, mildly dry lips & tongue, mildly dry mm . Neck:  no LAD, masses or thyromegaly . Cardiovascular:  RRR, no m/r/g. No LE edema.  Respiratory:   Mild diffuse rhonchi.  Normal respiratory effort on NRB O2. . Abdomen:  soft, NT, ND, NABS . Skin:  no rash or induration seen on limited exam . Musculoskeletal:  grossly normal tone BUE/BLE, good ROM, no bony abnormality . Psychiatric:  grossly normal mood and affect, speech fluent and appropriate, AOx3 Neurologic:  CN 2-12 grossly intact, moves all extremities in coordinated fashion    Radiological Exams on Admission: DG Chest Port 1 View  Result Date: 06/07/2019 CLINICAL DATA:  COVID, shortness of breath. EXAM: PORTABLE CHEST 1 VIEW COMPARISON:  Chest radiograph 08/15/2017 FINDINGS: Heart size within normal limits. Prominent airspace disease throughout the majority of the right lung and within the left lung base. A trace right pleural effusion is difficult to exclude. No evidence of pneumothorax. No acute bony abnormality. IMPRESSION: Prominent airspace disease throughout the majority of the right lung and within the left lung base. Findings are consistent with multifocal pneumonia. Radiographic follow-up to resolution recommended. Electronically Signed   By: 08/17/2017 DO   On: 06/07/2019 10:42    EKG: Independently reviewed.  NSR with rate 90; nonspecific ST changes with no evidence of acute ischemia   Labs on Admission: I have personally reviewed the available labs and imaging studies at the time of the admission.  Pertinent labs:   K+ 3.1 Glucose 123 Albumin 2.9 AST 50/ALT 36 LDH 540 HS troponin 8 Ferritin  720 CRP 5.3 Lactate 1.5 Procalcitonin 0.13 Normal CBC D-dimer 1.33 Fibrinogen 682 COVID POSITIVE   Assessment/Plan Principal Problem:   Acute hypoxemic respiratory failure due to COVID-19 (HCC)   Acute respiratory failure with hypoxia associated with COVID-19 infection associated PNA -Patient with presenting with SOB and weakness -She does not have a usual home O2 requirement and is currently requiring NRB O2 -COVID POSITIVE -The patient has no apparent comorbidities which may increase the risk for ARDS/MODS -Exam is concerning for development of ARDS/MODS due to respiratory distress -Pertinent labs concerning for COVID include normal WBC count; mildly increased LFTs; low procalcitonin; markedly elevated D-dimer (>1); elevated CRP (not >7); increased LDH; increased ferritin; increased  fibrinogen -CXR with multifocal opacities which may be c/w COVID vs. Multifocal PNA -Will not treat with broad-spectrum antibiotics given procalcitonin <0.25 -Will admit to [facility] Progressive Care Unit for further evaluation, close monitoring, and treatment -At this time, will attempt to avoid use of aerosolized medications and use HFAs instead -Will check daily labs including BMP with Mag, Phos; LFTs; CBC with differential; CRP; ferritin; fibrinogen; D-dimer -Will order steroids (1 mg/kg divided BID) and Remdesivir (pharmacy consult) given +COVID test, +CXR, and hypoxia <94% on room air -If the patient shows clinical deterioration, consider transfer to ICU with PCCM consultation -If the patient is hypoxic or on >3L oxygen from baseline or CRP >7, considerTocilizumab 8 mg/kg x1 IF + COVID test; O2 sats <88% on room air; and patient is at high risk for intubation.  Will defer to [facility] doctors regarding this medication since it is being used off label. -Will attempt to maintain euvolemia to a net negative fluid status -Will ask the patient to maintain an awake prone position for 16+ hours a day, if possible, with  a minimum of 2-3 hours at a time -With D-dimer <5, will use standard-dosed Lovenox for DVT prevention -Patient was seen wearing full PPE including: gown, gloves, head cover, N95, and face shield; donning and doffing was in compliance with current standards.    DVT prophylaxis:  Lovenox  Code Status:  Full - confirmed with patient Family Communication: None present; I attempted multiple telephone numbers and was unable to speak with family at the time of admission Disposition Plan:  Home once clinically improved Consults called: None  Admission status: Admit - It is my clinical opinion that admission to INPATIENT is reasonable and necessary because of the expectation that this patient will require hospital care that crosses at least 2 midnights to treat this condition based on the medical complexity of the problems presented.  Given the aforementioned information, the predictability of an adverse outcome is felt to be significant.     Jonah Blue MD [facility]   How to contact the [facility] Attending or Consulting provider 7A - 7P or covering provider during after hours 7P -7A, for this patient?  1. Check the care team in [facility] and look for a) attending/consulting TRH provider listed and b) the [facility] team listed 2. Log into [email or web address] and use [place]'s universal password to access. If you do not have the password, please contact the hospital operator. 3. Locate the [facility] provider you are looking for under [facility] and page to a number that you can be directly reached. 4. If you still have difficulty reaching the provider, please page the [facility] (Director on Call) for the Hospitalists listed on amion for assistance.   06/07/2019, 1:58 PM

## 2019-06-07 NOTE — ED Notes (Signed)
Claritin, flonase, lovenox unverified by pharmacist. Writer send message to pharmacy to have meds sent to main ED for administration.

## 2019-06-07 NOTE — ED Notes (Signed)
Pt was fed her dinner tray. Pt consumed 100% of tray. Pt given water to drink. Purewick canister empty at this time. No other concerns expressed by the pt. Pts son's telephone # is a bedside for pt to use.

## 2019-06-07 NOTE — ED Notes (Signed)
Spoke with son to update him on Pt status and transport to [employer].

## 2019-06-07 NOTE — ED Notes (Signed)
NT entered room to find pt w/o nonrebreather on and o2sat @ 74%. NT reapplied nonrebreather and o2sat increased to low 90s. Pt repositioned, fed sandwich, and given water at this time. Pt denies need for bathroom.

## 2019-06-07 NOTE — ED Provider Notes (Signed)
[facility] DEPT Provider Note   CSN: [id number] Arrival date & time: 06/07/19  0954     History Chief Complaint  Patient presents with  . COVID  . Shortness of Breath    Alexis Hickman is a 59 y.o. female.  Presents to ER with chief complaint of shortness of breath.  Reports symptoms started on 1229, reports that she tested positive this past Tuesday.  States initially her symptoms were just severe fatigue, generalized weakness however in the last few days she has been experiencing daily fevers, worsening cough and shortness of breath.  Came to ER today due to her increased shortness of breath.  No associated chest pain.  Last Tylenol dose yesterday.  Unsure if she has had a fever today.  Denies any chronic medical problems.  Utilize language line.  HPI     Past Medical History:  Diagnosis Date  . Allergy     Patient Active Problem List   Diagnosis Date Noted  . Acute hypoxemic respiratory failure due to COVID-19 ([place]) 06/07/2019  . Left flank pain 06/20/2016    History reviewed. No pertinent surgical history.   OB History   No obstetric history on file.     History reviewed. No pertinent family history.  Social History   Tobacco Use  . Smoking status: Never Smoker  . Smokeless tobacco: Never Used  Substance Use Topics  . Alcohol use: No    Alcohol/week: 0.0 standard drinks  . Drug use: No    Home Medications Prior to Admission medications   Medication Sig Start Date End Date Taking? Authorizing Provider  albuterol (PROVENTIL HFA;VENTOLIN HFA) 108 (90 Base) MCG/ACT inhaler Inhale 2 puffs into the lungs every 6 (six) hours as needed. 08/15/17   Jaynee Eagles, PA-C  cetirizine (ZYRTEC) 10 MG tablet Take 1 tablet (10 mg total) by mouth daily. 08/15/17   Jaynee Eagles, PA-C  fluticasone (FLONASE) 50 MCG/ACT nasal spray Place 2 sprays into both nostrils daily. 08/15/17   Jaynee Eagles, PA-C  montelukast (SINGULAIR) 10 MG tablet Take 1 tablet (10 mg  total) by mouth at bedtime. 08/15/17   Jaynee Eagles, PA-C    Allergies    Advil [ibuprofen]  Review of Systems   Review of Systems  Constitutional: Positive for chills, fatigue and fever.  HENT: Negative for ear pain and sore throat.   Eyes: Negative for pain and visual disturbance.  Respiratory: Positive for cough and shortness of breath.   Cardiovascular: Negative for chest pain and palpitations.  Gastrointestinal: Negative for abdominal pain and vomiting.  Genitourinary: Negative for dysuria and hematuria.  Musculoskeletal: Positive for myalgias. Negative for arthralgias and back pain.  Skin: Negative for color change and rash.  Neurological: Negative for seizures and syncope.  All other systems reviewed and are negative.   Physical Exam Updated Vital Signs BP 124/82   Pulse 96   Temp 98.1 F (36.7 C) (Oral)   Resp 19   SpO2 92%   Physical Exam Vitals and nursing note reviewed.  Constitutional:      General: She is not in acute distress.    Appearance: She is well-developed.  HENT:     Head: Normocephalic and atraumatic.  Eyes:     Conjunctiva/sclera: Conjunctivae normal.  Cardiovascular:     Rate and Rhythm: Normal rate and regular rhythm.     Heart sounds: No murmur.  Pulmonary:     Comments: Coarse breath sounds bilaterally, mild tachypnea, mild increased work of breathing but no  respiratory distress on nonrebreather Abdominal:     Palpations: Abdomen is soft.     Tenderness: There is no abdominal tenderness.  Musculoskeletal:     Cervical back: Neck supple.  Skin:    General: Skin is warm and dry.  Neurological:     Mental Status: She is alert.     ED Results / Procedures / Treatments   Labs (all labs ordered are listed, but only abnormal results are displayed) Labs Reviewed  CBC WITH DIFFERENTIAL/PLATELET - Abnormal; Notable for the following components:      Result Value   Abs Immature Granulocytes 0.09 (*)    All other components within normal  limits  COMPREHENSIVE METABOLIC PANEL - Abnormal; Notable for the following components:   Potassium 3.1 (*)    Glucose, Bld 123 (*)    Calcium 7.7 (*)    Albumin 2.9 (*)    AST 50 (*)    All other components within normal limits  D-DIMER, QUANTITATIVE (NOT AT [facility]) - Abnormal; Notable for the following components:   D-Dimer, Quant 1.33 (*)    All other components within normal limits  LACTATE DEHYDROGENASE - Abnormal; Notable for the following components:   LDH 540 (*)    All other components within normal limits  FERRITIN - Abnormal; Notable for the following components:   Ferritin 720 (*)    All other components within normal limits  FIBRINOGEN - Abnormal; Notable for the following components:   Fibrinogen 682 (*)    All other components within normal limits  C-REACTIVE PROTEIN - Abnormal; Notable for the following components:   CRP 5.3 (*)    All other components within normal limits  POC SARS CORONAVIRUS 2 AG -  ED - Abnormal; Notable for the following components:   SARS Coronavirus 2 Ag POSITIVE (*)    All other components within normal limits  CULTURE, BLOOD (ROUTINE X 2)  CULTURE, BLOOD (ROUTINE X 2)  LACTIC ACID, PLASMA  PROCALCITONIN  TRIGLYCERIDES  HIV ANTIBODY (ROUTINE TESTING W REFLEX)  TROPONIN I (HIGH SENSITIVITY)    EKG None  Radiology DG Chest Port 1 View  Result Date: 06/07/2019 CLINICAL DATA:  COVID, shortness of breath. EXAM: PORTABLE CHEST 1 VIEW COMPARISON:  Chest radiograph 08/15/2017 FINDINGS: Heart size within normal limits. Prominent airspace disease throughout the majority of the right lung and within the left lung base. A trace right pleural effusion is difficult to exclude. No evidence of pneumothorax. No acute bony abnormality. IMPRESSION: Prominent airspace disease throughout the majority of the right lung and within the left lung base. Findings are consistent with multifocal pneumonia. Radiographic follow-up to resolution recommended.  Electronically Signed   By: Jackey Loge DO   On: 06/07/2019 10:42    Procedures .Critical Care Performed by: Milagros Loll, MD Authorized by: Milagros Loll, MD   Critical care provider statement:    Critical care time (minutes):  45   Critical care was necessary to treat or prevent imminent or life-threatening deterioration of the following conditions:  Respiratory failure   Critical care was time spent personally by me on the following activities:  Discussions with consultants, evaluation of patient's response to treatment, examination of patient, ordering and performing treatments and interventions, ordering and review of laboratory studies, ordering and review of radiographic studies, pulse oximetry, re-evaluation of patient's condition, obtaining history from patient or surrogate and review of old charts   (including critical care time)  Medications Ordered in ED Medications  loratadine (CLARITIN) tablet  10 mg (has no administration in time range)  fluticasone (FLONASE) 50 MCG/ACT nasal spray 2 spray (has no administration in time range)  montelukast (SINGULAIR) tablet 10 mg (has no administration in time range)  enoxaparin (LOVENOX) injection 40 mg (has no administration in time range)  sodium chloride flush (NS) 0.9 % injection 3 mL (has no administration in time range)  sodium chloride flush (NS) 0.9 % injection 3 mL (has no administration in time range)  0.9 %  sodium chloride infusion (has no administration in time range)  remdesivir 200 mg in sodium chloride 0.9% 250 mL IVPB (has no administration in time range)    Followed by  remdesivir 100 mg in sodium chloride 0.9 % 100 mL IVPB (has no administration in time range)  albuterol (VENTOLIN HFA) 108 (90 Base) MCG/ACT inhaler 2 puff (has no administration in time range)  guaiFENesin-dextromethorphan (ROBITUSSIN DM) 100-10 MG/5ML syrup 10 mL (has no administration in time range)  chlorpheniramine-HYDROcodone (TUSSIONEX)  10-8 MG/5ML suspension 5 mL (has no administration in time range)  acetaminophen (TYLENOL) tablet 650 mg (has no administration in time range)  oxyCODONE (Oxy IR/ROXICODONE) immediate release tablet 5 mg (has no administration in time range)  polyethylene glycol (MIRALAX / GLYCOLAX) packet 17 g (has no administration in time range)  bisacodyl (DULCOLAX) EC tablet 5 mg (has no administration in time range)  sodium phosphate (FLEET) 7-19 GM/118ML enema 1 enema (has no administration in time range)  ondansetron (ZOFRAN) tablet 4 mg (has no administration in time range)    Or  ondansetron (ZOFRAN) injection 4 mg (has no administration in time range)  sodium chloride flush (NS) 0.9 % injection 3 mL (has no administration in time range)  dexamethasone (DECADRON) injection 6 mg (has no administration in time range)  dexamethasone (DECADRON) injection 8 mg (8 mg Intravenous Given 06/07/19 1143)    ED Course  I have reviewed the triage vital signs and the nursing notes.  Pertinent labs & imaging results that were available during my care of the patient were reviewed by me and considered in my medical decision making (see chart for details).  Clinical Course as of Jun 06 1356  Fri Jun 07, 2019  1016 Completed initial assessment, will need admission for COVID-19   [RD]  1048 Attempted to call emergency contact x2, daughter, no answer, no VM available   [RD]  1233 Discussed with Ophelia Charter   [RD]    Clinical Course User Index [RD] Milagros Loll, MD   MDM Rules/Calculators/A&P                      59 year old presents to ER known Covid with worsening shortness of breath.  CXR consistent with COVID-19 pneumonia.  Requiring nonrebreather to maintain O2 sat greater than 90.  Suspect presentation today related to COVID-19.  Will admit to hospitalist for further management.  Patient states she would be okay with intubation and ventilator if needed, will notify hospitalist.  Attempted to contact family  however no answer.  Dr. Ophelia Charter will accept to hospitalist service, likely transfer to [facility].  Final Clinical Impression(s) / ED Diagnoses Final diagnoses:  COVID-19  Pneumonia due to COVID-19 virus  Acute respiratory failure with hypoxia [facility])    Rx / DC Orders ED Discharge Orders    None       Milagros Loll, MD 06/07/19 1358

## 2019-06-07 NOTE — ED Notes (Signed)
Called pts son to inform him that pt wanted to speak to him. Call was transferred to the pts room and pt was able to talk to son.

## 2019-06-07 NOTE — ED Triage Notes (Signed)
EMS reports from home, Pt speaks [place] family reported Pt tested Positive Covid on 12/29. Pt c/o SOB today 60 Sp02 RA on scene placed on non rebreather @ 10ltrs Sp02 up to 92.  BP 128/93 HR 100 RR 24 Sp02 92 NRB 10ltrs Temp 97.9

## 2019-06-07 NOTE — ED Notes (Signed)
Remdesivir Dose rescheduled. Awaiting new dose from pharmacy

## 2019-06-07 NOTE — ED Notes (Signed)
[facility] called for transport to [facility] 

## 2019-06-07 NOTE — ED Notes (Signed)
Attempted to call report to [facility]. No answer.

## 2019-06-07 NOTE — ED Notes (Signed)
Pt provided with spoon to eat her fruit at bedside. Pt is currently comfortable.

## 2019-06-08 DIAGNOSIS — J1282 Pneumonia due to coronavirus disease 2019: Secondary | ICD-10-CM

## 2019-06-08 LAB — CBC WITH DIFFERENTIAL/PLATELET
Abs Immature Granulocytes: 0.03 10*3/uL (ref 0.00–0.07)
Basophils Absolute: 0 10*3/uL (ref 0.0–0.1)
Basophils Relative: 0 %
Eosinophils Absolute: 0 10*3/uL (ref 0.0–0.5)
Eosinophils Relative: 0 %
HCT: 38.6 % (ref 36.0–46.0)
Hemoglobin: 12.5 g/dL (ref 12.0–15.0)
Immature Granulocytes: 1 %
Lymphocytes Relative: 19 %
Lymphs Abs: 0.9 10*3/uL (ref 0.7–4.0)
MCH: 29.8 pg (ref 26.0–34.0)
MCHC: 32.4 g/dL (ref 30.0–36.0)
MCV: 91.9 fL (ref 80.0–100.0)
Monocytes Absolute: 0.6 10*3/uL (ref 0.1–1.0)
Monocytes Relative: 12 %
Neutro Abs: 3.1 10*3/uL (ref 1.7–7.7)
Neutrophils Relative %: 68 %
Platelets: 214 10*3/uL (ref 150–400)
RBC: 4.2 MIL/uL (ref 3.87–5.11)
RDW: 13.3 % (ref 11.5–15.5)
WBC: 4.6 10*3/uL (ref 4.0–10.5)
nRBC: 0 % (ref 0.0–0.2)

## 2019-06-08 LAB — COMPREHENSIVE METABOLIC PANEL
ALT: 31 U/L (ref 0–44)
AST: 39 U/L (ref 15–41)
Albumin: 2.7 g/dL — ABNORMAL LOW (ref 3.5–5.0)
Alkaline Phosphatase: 49 U/L (ref 38–126)
Anion gap: 9 (ref 5–15)
BUN: 30 mg/dL — ABNORMAL HIGH (ref 8–23)
CO2: 26 mmol/L (ref 22–32)
Calcium: 8 mg/dL — ABNORMAL LOW (ref 8.9–10.3)
Chloride: 105 mmol/L (ref 98–111)
Creatinine, Ser: 0.68 mg/dL (ref 0.44–1.00)
GFR calc Af Amer: >60 mL/min (ref >60)
GFR calc non Af Amer: >60 mL/min (ref >60)
Glucose, Bld: 130 mg/dL — ABNORMAL HIGH (ref 70–99)
Potassium: 4.7 mmol/L (ref 3.5–5.1)
Sodium: 140 mmol/L (ref 135–145)
Total Bilirubin: 0.4 mg/dL (ref 0.3–1.2)
Total Protein: 6.4 g/dL — ABNORMAL LOW (ref 6.5–8.1)

## 2019-06-08 LAB — MAGNESIUM: Magnesium: 2.7 mg/dL — ABNORMAL HIGH (ref 1.7–2.4)

## 2019-06-08 LAB — D-DIMER, QUANTITATIVE: D-Dimer, Quant: 1.75 ug/mL-FEU — ABNORMAL HIGH (ref 0.00–0.50)

## 2019-06-08 LAB — PHOSPHORUS: Phosphorus: 2.6 mg/dL (ref 2.5–4.6)

## 2019-06-08 LAB — FERRITIN: Ferritin: 493 ng/mL — ABNORMAL HIGH (ref 11–307)

## 2019-06-08 LAB — C-REACTIVE PROTEIN: CRP: 3.1 mg/dL — ABNORMAL HIGH (ref <1.0)

## 2019-06-08 MED ORDER — IPRATROPIUM-ALBUTEROL 20-100 MCG/ACT IN AERS
1.0000 | INHALATION_SPRAY | Freq: Four times a day (QID) | RESPIRATORY_TRACT | Status: DC
  Administered 2019-06-08 – 2019-06-12 (×18): 1 via RESPIRATORY_TRACT
  Filled 2019-06-08: qty 4

## 2019-06-08 MED ORDER — ZINC SULFATE 220 (50 ZN) MG PO CAPS
220.0000 mg | ORAL_CAPSULE | Freq: Every day | ORAL | Status: DC
  Administered 2019-06-08 – 2019-06-13 (×6): 220 mg via ORAL
  Filled 2019-06-08 (×6): qty 1

## 2019-06-08 MED ORDER — ORAL CARE MOUTH RINSE
15.0000 mL | Freq: Two times a day (BID) | OROMUCOSAL | Status: DC
  Administered 2019-06-08 – 2019-06-13 (×11): 15 mL via OROMUCOSAL

## 2019-06-08 MED ORDER — ASCORBIC ACID 500 MG PO TABS
500.0000 mg | ORAL_TABLET | Freq: Every day | ORAL | Status: DC
  Administered 2019-06-08 – 2019-06-13 (×6): 500 mg via ORAL
  Filled 2019-06-08 (×6): qty 1

## 2019-06-08 NOTE — ED Notes (Signed)
Recalled report to nurse Lauri at [facility]

## 2019-06-08 NOTE — Plan of Care (Signed)
02 sats will maintain >92% AT DISCHARGE

## 2019-06-08 NOTE — Progress Notes (Signed)
PROGRESS NOTE    Alexis Hickman  ATF:[id number] DOB: [birth year] DOA: 06/07/2019 PCP: Patient, No Pcp Per   Brief Narrative:  Alexis Hickman is a 59 y.o.  [place] speaking female PMHx none  Presenting with worsening COVID-19 symptoms.  Patient tested positive for COVID-19 on 12/29.  Patient's son also has COVID and she reports that he gave it to her.  She developed weakness and fatigue and tested positive about 10 days ago.  Over the last day or so, she has had progressive SOB.  She is currently on 15L NRB O2 with normal O2 sats and is primarily concerning of hunger.  History was obtained through the teleinterpreter.   Subjective: A/O afebrile overnight   Assessment & Plan:   Principal Problem:   Acute hypoxemic respiratory failure due to COVID-19 (HCC)   Covid pneumonia/acute respiratory failure with hypoxia COVID-19 Labs  Recent Labs    06/07/19 1008  DDIMER 1.33*  FERRITIN 720*  LDH 540*  CRP 5.3*    1/8 POC SARS coronavirus positive -Decadron 6 mg daily -Remdesivir per pharmacy protocol -Currently does not meet guidelines for use of Actemra -Vitamins per Covid protocol -Combivent -Flutter valve -Incentive spirometer    DVT prophylaxis: Lovenox Code Status: Full Family Communication:  Disposition Plan: TBD   Consultants:    Procedures/Significant Events:     I have personally reviewed and interpreted all radiology studies and my findings are as above.  VENTILATOR SETTINGS: HFNC 1/9 Flow; 15 L/min SPO2; 98%     Cultures 1/8 POC SARS coronavirus positive 1/8 HIV screen negative 1/8 blood pending    Antimicrobials: Anti-infectives (From admission, onward)   Start     Dose/Rate Stop   06/08/19 1000  remdesivir 100 mg in sodium chloride 0.9 % 100 mL IVPB     100 mg 200 mL/hr over 30 Minutes 06/12/19 0959   06/07/19 1430  remdesivir 200 mg in sodium chloride 0.9% 250 mL IVPB     200 mg 580 mL/hr over 30 Minutes 06/08/19 0952        Devices    LINES / TUBES:      Continuous Infusions: . sodium chloride    . remdesivir 100 mg in NS 100 mL       Objective: Vitals:   06/08/19 0000 06/08/19 0400 06/08/19 0500 06/08/19 0758  BP: 110/79 92/68 100/74 102/71  Pulse: 93 66 65 74  Resp: (!) 37 (!) 24 (!) 23 (!) 22  Temp:    98.4 F (36.9 C)  TempSrc:    Axillary  SpO2: (!) 89% 95% 96% 94%    Intake/Output Summary (Last 24 hours) at 06/08/2019 2706 Last data filed at 06/08/2019 0100 Gross per 24 hour  Intake 250 ml  Output 400 ml  Net -150 ml   There were no vitals filed for this visit.  Examination:  General: A/O, positive acute respiratory distress Eyes: negative scleral hemorrhage, negative anisocoria, negative icterus ENT: Negative Runny nose, negative gingival bleeding, Neck:  Negative scars, masses, torticollis, lymphadenopathy, JVD Lungs: Clear to auscultation bilaterally without wheezes or crackles Cardiovascular: Regular rate and rhythm without murmur gallop or rub normal S1 and S2 Abdomen: negative abdominal pain, nondistended, positive soft, bowel sounds, no rebound, no ascites, no appreciable mass Extremities: No significant cyanosis, clubbing, or edema bilateral lower extremities Skin: Negative rashes, lesions, ulcers Psychiatric:  Negative depression, negative anxiety, negative fatigue, negative mania  Central nervous system:  Cranial nerves II through XII intact, tongue/uvula midline, all extremities muscle strength 5/5,  sensation intact throughout,  negative dysarthria, negative expressive aphasia, negative receptive aphasia.  .     Data Reviewed: Care during the described time interval was provided by me .  I have reviewed this patient's available data, including medical history, events of note, physical examination, and all test results as part of my evaluation.   CBC: Recent Labs  Lab 06/07/19 1008 06/08/19 0627  WBC 5.4 4.6  NEUTROABS 3.7 PENDING  HGB 13.2 12.5  HCT  40.4 38.6  MCV 91.2 91.9  PLT 199 214   Basic Metabolic Panel: Recent Labs  Lab 06/07/19 1008  NA 135  K 3.1*  CL 98  CO2 26  GLUCOSE 123*  BUN 21  CREATININE 0.69  CALCIUM 7.7*   GFR: CrCl cannot be calculated (Unknown ideal weight.). Liver Function Tests: Recent Labs  Lab 06/07/19 1008  AST 50*  ALT 36  ALKPHOS 51  BILITOT 1.2  PROT 6.9  ALBUMIN 2.9*   No results for input(s): LIPASE, AMYLASE in the last 168 hours. No results for input(s): AMMONIA in the last 168 hours. Coagulation Profile: No results for input(s): INR, PROTIME in the last 168 hours. Cardiac Enzymes: No results for input(s): CKTOTAL, CKMB, CKMBINDEX, TROPONINI in the last 168 hours. BNP (last 3 results) No results for input(s): PROBNP in the last 8760 hours. HbA1C: No results for input(s): HGBA1C in the last 72 hours. CBG: No results for input(s): GLUCAP in the last 168 hours. Lipid Profile: Recent Labs    06/07/19 1008  TRIG 147   Thyroid Function Tests: No results for input(s): TSH, T4TOTAL, FREET4, T3FREE, THYROIDAB in the last 72 hours. Anemia Panel: Recent Labs    06/07/19 1008  FERRITIN 720*   Urine analysis:    Component Value Date/Time   BILIRUBINUR negative 08/15/2017 1058   KETONESUR negative 08/15/2017 1058   PROTEINUR negative 08/15/2017 1058   UROBILINOGEN 0.2 08/15/2017 1058   NITRITE Negative 08/15/2017 1058   LEUKOCYTESUR Negative 08/15/2017 1058   Sepsis Labs: @LABRCNTIP (procalcitonin:4,lacticidven:4)  ) Recent Results (from the past 240 hour(s))  Blood Culture (routine x 2)     Status: None (Preliminary result)   Collection Time: 06/07/19 10:08 AM   Specimen: BLOOD  Result Value Ref Range Status   Specimen Description   Final    BLOOD LEFT ANTECUBITAL Performed at [facility], [address] [place], [place]    Special Requests   Final    BOTTLES DRAWN AEROBIC AND ANAEROBIC Blood Culture adequate volume Performed at  [facility], [address], [place], [place]    Culture   Final    NO GROWTH < 24 HOURS Performed at [facility], [address] [place], [place]    Report Status PENDING  Incomplete  Blood Culture (routine x 2)     Status: None (Preliminary result)   Collection Time: 06/07/19 10:53 AM   Specimen: BLOOD  Result Value Ref Range Status   Specimen Description   Final    BLOOD RIGHT ANTECUBITAL Performed at [facility], [address], [place], [place]    Special Requests   Final    BOTTLES DRAWN AEROBIC AND ANAEROBIC Blood Culture adequate volume Performed at [facility], [address], [place], [place]    Culture   Final    NO GROWTH < 24 HOURS Performed at [facility], [address] [place], [place]  Report Status PENDING  Incomplete         Radiology Studies: DG Chest Port 1 View  Result Date: 06/07/2019 CLINICAL DATA:  COVID, shortness of breath. EXAM: PORTABLE CHEST 1 VIEW COMPARISON:  Chest radiograph 08/15/2017 FINDINGS: Heart size within normal limits. Prominent airspace disease throughout the majority of the right lung and within the left lung base. A trace right pleural effusion is difficult to exclude. No evidence of pneumothorax. No acute bony abnormality. IMPRESSION: Prominent airspace disease throughout the majority of the right lung and within the left lung base. Findings are consistent with multifocal pneumonia. Radiographic follow-up to resolution recommended. Electronically Signed   By: Kellie Simmering DO   On: 06/07/2019 10:42        Scheduled Meds: . dexamethasone (DECADRON) injection  6 mg Intravenous Q24H  . enoxaparin (LOVENOX) injection  40 mg Subcutaneous Q24H  . mouth rinse  15 mL Mouth Rinse BID  . sodium chloride flush  3 mL Intravenous Q12H  . sodium chloride flush  3 mL Intravenous Q12H   Continuous Infusions: .  sodium chloride    . remdesivir 100 mg in NS 100 mL       LOS: 1 day   The patient is critically ill with multiple organ systems failure and requires high complexity decision making for assessment and support, frequent evaluation and titration of therapies, application of advanced monitoring technologies and extensive interpretation of multiple databases. Critical Care Time devoted to patient care services described in this note  Time spent: 40 minutes     Alexis Hickman, Geraldo Docker, MD [facility] Pager [phone]  If 7PM-7AM, please contact night-coverage [email or web address] Password [email or web address] 06/08/2019, 8:07 AM

## 2019-06-09 LAB — CBC WITH DIFFERENTIAL/PLATELET
Abs Immature Granulocytes: 0.05 10*3/uL (ref 0.00–0.07)
Basophils Absolute: 0 10*3/uL (ref 0.0–0.1)
Basophils Relative: 0 %
Eosinophils Absolute: 0 10*3/uL (ref 0.0–0.5)
Eosinophils Relative: 0 %
HCT: 38.6 % (ref 36.0–46.0)
Hemoglobin: 12.5 g/dL (ref 12.0–15.0)
Immature Granulocytes: 1 %
Lymphocytes Relative: 13 %
Lymphs Abs: 1.1 10*3/uL (ref 0.7–4.0)
MCH: 29.8 pg (ref 26.0–34.0)
MCHC: 32.4 g/dL (ref 30.0–36.0)
MCV: 92.1 fL (ref 80.0–100.0)
Monocytes Absolute: 0.6 10*3/uL (ref 0.1–1.0)
Monocytes Relative: 7 %
Neutro Abs: 6.9 10*3/uL (ref 1.7–7.7)
Neutrophils Relative %: 79 %
Platelets: 252 10*3/uL (ref 150–400)
RBC: 4.19 MIL/uL (ref 3.87–5.11)
RDW: 13.4 % (ref 11.5–15.5)
WBC: 8.6 10*3/uL (ref 4.0–10.5)
nRBC: 0 % (ref 0.0–0.2)

## 2019-06-09 LAB — COMPREHENSIVE METABOLIC PANEL
ALT: 30 U/L (ref 0–44)
AST: 31 U/L (ref 15–41)
Albumin: 2.7 g/dL — ABNORMAL LOW (ref 3.5–5.0)
Alkaline Phosphatase: 50 U/L (ref 38–126)
Anion gap: 9 (ref 5–15)
BUN: 32 mg/dL — ABNORMAL HIGH (ref 8–23)
CO2: 25 mmol/L (ref 22–32)
Calcium: 8.1 mg/dL — ABNORMAL LOW (ref 8.9–10.3)
Chloride: 106 mmol/L (ref 98–111)
Creatinine, Ser: 0.59 mg/dL (ref 0.44–1.00)
GFR calc Af Amer: >60 mL/min (ref >60)
GFR calc non Af Amer: >60 mL/min (ref >60)
Glucose, Bld: 116 mg/dL — ABNORMAL HIGH (ref 70–99)
Potassium: 4.3 mmol/L (ref 3.5–5.1)
Sodium: 140 mmol/L (ref 135–145)
Total Bilirubin: 0.5 mg/dL (ref 0.3–1.2)
Total Protein: 6.3 g/dL — ABNORMAL LOW (ref 6.5–8.1)

## 2019-06-09 LAB — FERRITIN: Ferritin: 427 ng/mL — ABNORMAL HIGH (ref 11–307)

## 2019-06-09 LAB — C-REACTIVE PROTEIN: CRP: 1.4 mg/dL — ABNORMAL HIGH (ref <1.0)

## 2019-06-09 LAB — MAGNESIUM: Magnesium: 2.3 mg/dL (ref 1.7–2.4)

## 2019-06-09 LAB — PHOSPHORUS: Phosphorus: 3 mg/dL (ref 2.5–4.6)

## 2019-06-09 LAB — D-DIMER, QUANTITATIVE: D-Dimer, Quant: 5.12 ug/mL-FEU — ABNORMAL HIGH (ref 0.00–0.50)

## 2019-06-09 NOTE — Progress Notes (Addendum)
PROGRESS NOTE    Alexis Hickman  HMC:[id number] DOB: [birth year] DOA: 06/07/2019 PCP: Patient, No Pcp Per   Brief Narrative:  Alexis Hickman is a 59 y.o.  [place] speaking female PMHx none  Presenting with worsening COVID-19 symptoms.  Patient tested positive for COVID-19 on 12/29.  Patient's son also has COVID and she reports that he gave it to her.  She developed weakness and fatigue and tested positive about 10 days ago.  Over the last day or so, she has had progressive SOB.  She is currently on 15L NRB O2 with normal O2 sats and is primarily concerning of hunger.  History was obtained through the teleinterpreter.   Subjective: 1/10 afebrile overnight, (through interpreter), negative CP, negative abdominal pain.  Positive S OB but feels has improved.   Assessment & Plan:   Principal Problem:   Acute hypoxemic respiratory failure due to COVID-19 [facility])   Covid pneumonia/acute respiratory failure with hypoxia COVID-19 Labs  Recent Labs    06/07/19 1008 06/08/19 0627 06/09/19 0232  DDIMER 1.33* 1.75* 5.12*  FERRITIN 720* 493* 427*  LDH 540*  --   --   CRP 5.3* 3.1* 1.4*    1/8 POC SARS coronavirus positive -Decadron 6 mg daily -Remdesivir per pharmacy protocol -Currently does not meet guidelines for use of Actemra -Vitamins per Covid protocol -Combivent -Flutter valve -Incentive spirometer -Prone 16 hours/day, if patient cannot tolerate it prone 2 to 3 hours per shift -Titrate O2 to maintain SPO2> 88%    DVT prophylaxis: Lovenox Code Status: Full Family Communication: 1/10 spoke with Vien (son) counseled on plan of care answered all questions  Disposition Plan: TBD   Consultants:    Procedures/Significant Events:     I have personally reviewed and interpreted all radiology studies and my findings are as above.  VENTILATOR SETTINGS: HFNC 1/10 Flow; 15 L/min SPO2; 97%     Cultures 1/8 POC SARS coronavirus positive 1/8 HIV screen negative 1/8 blood  pending    Antimicrobials: Anti-infectives (From admission, onward)   Start     Dose/Rate Stop   06/08/19 1000  remdesivir 100 mg in sodium chloride 0.9 % 100 mL IVPB     100 mg 200 mL/hr over 30 Minutes 06/12/19 0959   06/07/19 1430  remdesivir 200 mg in sodium chloride 0.9% 250 mL IVPB     200 mg 580 mL/hr over 30 Minutes 06/08/19 0952       Devices    LINES / TUBES:      Continuous Infusions: . sodium chloride    . remdesivir 100 mg in NS 100 mL Stopped (06/08/19 1015)     Objective: Vitals:   06/08/19 2016 06/09/19 0000 06/09/19 0412 06/09/19 0800  BP: 111/75 111/78  113/80  Pulse: 92 88  79  Resp: 18 (!) 24 (!) 25 (!) 23  Temp:  98 F (36.7 C) 98.2 F (36.8 C) (!) 97.5 F (36.4 C)  TempSrc:  Axillary Axillary Axillary  SpO2: 93% 96%  95%  Weight:      Height:        Intake/Output Summary (Last 24 hours) at 06/09/2019 1041 Last data filed at 06/09/2019 0500 Gross per 24 hour  Intake 240 ml  Output 320 ml  Net -80 ml   Filed Weights   06/08/19 1400  Weight: 57.9 kg     General: A/O x4, positive acute respiratory distress Eyes: negative scleral hemorrhage, negative anisocoria, negative icterus ENT: Negative Runny nose, negative gingival bleeding, Neck:  Negative  scars, masses, torticollis, lymphadenopathy, JVD Lungs: Tachypneic decreased breath sounds bilaterally without wheezes or crackles Cardiovascular: Regular rate and rhythm without murmur gallop or rub normal S1 and S2 Abdomen: negative abdominal pain, nondistended, positive soft, bowel sounds, no rebound, no ascites, no appreciable mass Extremities: No significant cyanosis, clubbing, or edema bilateral lower extremities Skin: Negative rashes, lesions, ulcers Psychiatric:  Negative depression, negative anxiety, negative fatigue, negative mania  Central nervous system:  Cranial nerves II through XII intact, tongue/uvula midline, all extremities muscle strength 5/5, sensation intact  throughout, negative dysarthria, negative expressive aphasia, negative receptive aphasia. .     Data Reviewed: Care during the described time interval was provided by me .  I have reviewed this patient's available data, including medical history, events of note, physical examination, and all test results as part of my evaluation.   CBC: Recent Labs  Lab 06/07/19 1008 06/08/19 0627 06/09/19 0232  WBC 5.4 4.6 8.6  NEUTROABS 3.7 3.1 6.9  HGB 13.2 12.5 12.5  HCT 40.4 38.6 38.6  MCV 91.2 91.9 92.1  PLT 199 214 252   Basic Metabolic Panel: Recent Labs  Lab 06/07/19 1008 06/08/19 0627 06/09/19 0232  NA 135 140 140  K 3.1* 4.7 4.3  CL 98 105 106  CO2 26 26 25   GLUCOSE 123* 130* 116*  BUN 21 30* 32*  CREATININE 0.69 0.68 0.59  CALCIUM 7.7* 8.0* 8.1*  MG  --  2.7* 2.3  PHOS  --  2.6 3.0   GFR: Estimated Creatinine Clearance: 58.9 mL/min (by C-G formula based on SCr of 0.59 mg/dL). Liver Function Tests: Recent Labs  Lab 06/07/19 1008 06/08/19 0627 06/09/19 0232  AST 50* 39 31  ALT 36 31 30  ALKPHOS 51 49 50  BILITOT 1.2 0.4 0.5  PROT 6.9 6.4* 6.3*  ALBUMIN 2.9* 2.7* 2.7*   No results for input(s): LIPASE, AMYLASE in the last 168 hours. No results for input(s): AMMONIA in the last 168 hours. Coagulation Profile: No results for input(s): INR, PROTIME in the last 168 hours. Cardiac Enzymes: No results for input(s): CKTOTAL, CKMB, CKMBINDEX, TROPONINI in the last 168 hours. BNP (last 3 results) No results for input(s): PROBNP in the last 8760 hours. HbA1C: No results for input(s): HGBA1C in the last 72 hours. CBG: No results for input(s): GLUCAP in the last 168 hours. Lipid Profile: Recent Labs    06/07/19 1008  TRIG 147   Thyroid Function Tests: No results for input(s): TSH, T4TOTAL, FREET4, T3FREE, THYROIDAB in the last 72 hours. Anemia Panel: Recent Labs    06/08/19 0627 06/09/19 0232  FERRITIN 493* 427*   Urine analysis:    Component Value  Date/Time   BILIRUBINUR negative 08/15/2017 1058   KETONESUR negative 08/15/2017 1058   PROTEINUR negative 08/15/2017 1058   UROBILINOGEN 0.2 08/15/2017 1058   NITRITE Negative 08/15/2017 1058   LEUKOCYTESUR Negative 08/15/2017 1058   Sepsis Labs: @LABRCNTIP (procalcitonin:4,lacticidven:4)  ) Recent Results (from the past 240 hour(s))  Blood Culture (routine x 2)     Status: None (Preliminary result)   Collection Time: 06/07/19 10:08 AM   Specimen: BLOOD  Result Value Ref Range Status   Specimen Description   Final    BLOOD LEFT ANTECUBITAL Performed at [facility], [address] [place], [place]    Special Requests   Final    BOTTLES DRAWN AEROBIC AND ANAEROBIC Blood Culture adequate volume Performed at [facility], [address], [place], [place]  Culture   Final    NO GROWTH < 24 HOURS Performed at [facility], [address] [place], [place] [address]    Report Status PENDING  Incomplete  Blood Culture (routine x 2)     Status: None (Preliminary result)   Collection Time: 06/07/19 10:53 AM   Specimen: BLOOD  Result Value Ref Range Status   Specimen Description   Final    BLOOD RIGHT ANTECUBITAL Performed at [facility], [address], [place], [place] [address]    Special Requests   Final    BOTTLES DRAWN AEROBIC AND ANAEROBIC Blood Culture adequate volume Performed at [facility], [address] [place], [place] [address]    Culture   Final    NO GROWTH < 24 HOURS Performed at [facility], [address] [place], [place] [address]    Report Status PENDING  Incomplete         Radiology Studies: No results found.      Scheduled Meds: . vitamin C  500 mg Oral Daily  . dexamethasone (DECADRON) injection  6 mg Intravenous Q24H  . enoxaparin (LOVENOX) injection  40 mg Subcutaneous Q24H  . Ipratropium-Albuterol  1 puff Inhalation QID   . mouth rinse  15 mL Mouth Rinse BID  . sodium chloride flush  3 mL Intravenous Q12H  . sodium chloride flush  3 mL Intravenous Q12H  . zinc sulfate  220 mg Oral Daily   Continuous Infusions: . sodium chloride    . remdesivir 100 mg in NS 100 mL Stopped (06/08/19 1015)     LOS: 2 days   The patient is critically ill with multiple organ systems failure and requires high complexity decision making for assessment and support, frequent evaluation and titration of therapies, application of advanced monitoring technologies and extensive interpretation of multiple databases. Critical Care Time devoted to patient care services described in this note  Time spent: 40 minutes     Lakendra Helling, Roselind Messier, MD [facility] Pager [phone]  If 7PM-7AM, please contact night-coverage [email or web address] Password [facility] 06/09/2019, 10:41 AM

## 2019-06-09 NOTE — Plan of Care (Signed)
Patient continues to require high amounts of o2, most of shift she remained on 15L HFNC with intermittent use of non rebreather with exertion, desat to lower 80's when up to bsc w/o NRB, Lethargic, safety precautions maintained, continue POC at this time Problem: Activity: Goal: Ability to tolerate increased activity will improve Outcome: Not Progressing   Problem: Clinical Measurements: Goal: Ability to maintain a body temperature in the normal range will improve Outcome: Not Progressing   Problem: Respiratory: Goal: Ability to maintain adequate ventilation will improve Outcome: Not Progressing Goal: Ability to maintain a clear airway will improve Outcome: Not Progressing   Problem: Education: Goal: Knowledge of risk factors and measures for prevention of condition will improve Outcome: Not Progressing   Problem: Coping: Goal: Psychosocial and spiritual needs will be supported Outcome: Not Progressing   Problem: Respiratory: Goal: Will maintain a patent airway Outcome: Not Progressing Goal: Complications related to the disease process, condition or treatment will be avoided or minimized Outcome: Not Progressing   Problem: Education: Goal: Knowledge of General Education information will improve Description: Including pain rating scale, medication(s)/side effects and non-pharmacologic comfort measures Outcome: Not Progressing   Problem: Health Behavior/Discharge Planning: Goal: Ability to manage health-related needs will improve Outcome: Not Progressing   Problem: Clinical Measurements: Goal: Ability to maintain clinical measurements within normal limits will improve Outcome: Not Progressing Goal: Will remain free from infection Outcome: Not Progressing Goal: Diagnostic test results will improve Outcome: Not Progressing Goal: Respiratory complications will improve Outcome: Not Progressing Goal: Cardiovascular complication will be avoided Outcome: Not Progressing    Problem: Activity: Goal: Risk for activity intolerance will decrease Outcome: Not Progressing   Problem: Nutrition: Goal: Adequate nutrition will be maintained Outcome: Not Progressing   Problem: Coping: Goal: Level of anxiety will decrease Outcome: Not Progressing   Problem: Elimination: Goal: Will not experience complications related to bowel motility Outcome: Not Progressing Goal: Will not experience complications related to urinary retention Outcome: Not Progressing   Problem: Pain Managment: Goal: General experience of comfort will improve Outcome: Not Progressing   Problem: Safety: Goal: Ability to remain free from injury will improve Outcome: Not Progressing   Problem: Skin Integrity: Goal: Risk for impaired skin integrity will decrease Outcome: Not Progressing

## 2019-06-10 LAB — CBC WITH DIFFERENTIAL/PLATELET
Abs Immature Granulocytes: 0.05 10*3/uL (ref 0.00–0.07)
Basophils Absolute: 0 10*3/uL (ref 0.0–0.1)
Basophils Relative: 0 %
Eosinophils Absolute: 0 10*3/uL (ref 0.0–0.5)
Eosinophils Relative: 0 %
HCT: 38.3 % (ref 36.0–46.0)
Hemoglobin: 12.4 g/dL (ref 12.0–15.0)
Immature Granulocytes: 1 %
Lymphocytes Relative: 12 %
Lymphs Abs: 1.2 10*3/uL (ref 0.7–4.0)
MCH: 30 pg (ref 26.0–34.0)
MCHC: 32.4 g/dL (ref 30.0–36.0)
MCV: 92.5 fL (ref 80.0–100.0)
Monocytes Absolute: 0.4 10*3/uL (ref 0.1–1.0)
Monocytes Relative: 4 %
Neutro Abs: 8.9 10*3/uL — ABNORMAL HIGH (ref 1.7–7.7)
Neutrophils Relative %: 83 %
Platelets: 269 10*3/uL (ref 150–400)
RBC: 4.14 MIL/uL (ref 3.87–5.11)
RDW: 13.4 % (ref 11.5–15.5)
WBC: 10.6 10*3/uL — ABNORMAL HIGH (ref 4.0–10.5)
nRBC: 0 % (ref 0.0–0.2)

## 2019-06-10 LAB — C-REACTIVE PROTEIN: CRP: 1.9 mg/dL — ABNORMAL HIGH (ref <1.0)

## 2019-06-10 LAB — COMPREHENSIVE METABOLIC PANEL
ALT: 24 U/L (ref 0–44)
AST: 24 U/L (ref 15–41)
Albumin: 2.6 g/dL — ABNORMAL LOW (ref 3.5–5.0)
Alkaline Phosphatase: 49 U/L (ref 38–126)
Anion gap: 12 (ref 5–15)
BUN: 26 mg/dL — ABNORMAL HIGH (ref 8–23)
CO2: 24 mmol/L (ref 22–32)
Calcium: 8.2 mg/dL — ABNORMAL LOW (ref 8.9–10.3)
Chloride: 105 mmol/L (ref 98–111)
Creatinine, Ser: 0.62 mg/dL (ref 0.44–1.00)
GFR calc Af Amer: >60 mL/min (ref >60)
GFR calc non Af Amer: >60 mL/min (ref >60)
Glucose, Bld: 98 mg/dL (ref 70–99)
Potassium: 4.1 mmol/L (ref 3.5–5.1)
Sodium: 141 mmol/L (ref 135–145)
Total Bilirubin: 0.6 mg/dL (ref 0.3–1.2)
Total Protein: 6.3 g/dL — ABNORMAL LOW (ref 6.5–8.1)

## 2019-06-10 LAB — FERRITIN: Ferritin: 381 ng/mL — ABNORMAL HIGH (ref 11–307)

## 2019-06-10 LAB — D-DIMER, QUANTITATIVE: D-Dimer, Quant: 5.37 ug/mL-FEU — ABNORMAL HIGH (ref 0.00–0.50)

## 2019-06-10 LAB — MAGNESIUM: Magnesium: 2.1 mg/dL (ref 1.7–2.4)

## 2019-06-10 LAB — PHOSPHORUS: Phosphorus: 3 mg/dL (ref 2.5–4.6)

## 2019-06-10 MED ORDER — SALINE SPRAY 0.65 % NA SOLN
1.0000 | NASAL | Status: DC | PRN
  Filled 2019-06-10: qty 44

## 2019-06-10 NOTE — Plan of Care (Signed)
?  Problem: Respiratory: ?Goal: Ability to maintain adequate ventilation will improve ?Outcome: Progressing ?Goal: Ability to maintain a clear airway will improve ?Outcome: Progressing ?  ?

## 2019-06-10 NOTE — Progress Notes (Signed)
PROGRESS NOTE    Khristian Seals  EQA:[id number] DOB: December 14, 1960 DOA: 06/07/2019 PCP: Patient, No Pcp Per   Brief Narrative:  Da Michelle is a 59 y.o.  [place] speaking female PMHx none  Presenting with worsening COVID-19 symptoms.  Patient tested positive for COVID-19 on 12/29.  Patient's son also has COVID and she reports that he gave it to her.  She developed weakness and fatigue and tested positive about 10 days ago.  Over the last day or so, she has had progressive SOB.  She is currently on 15L NRB O2 with normal O2 sats and is primarily concerning of hunger.  History was obtained through the teleinterpreter.   Subjective: 1/11 afebrile overnight A/O x4, positive S OB.  Does not like to keep her O2 on as it irritates her nose.    Assessment & Plan:   Principal Problem:   Acute hypoxemic respiratory failure due to COVID-19 [facility])   Covid pneumonia/acute respiratory failure with hypoxia COVID-19 Labs  Recent Labs    06/08/19 0627 06/09/19 0232 06/10/19 0424  DDIMER 1.75* 5.12* 5.37*  FERRITIN 493* 427* 381*  CRP 3.1* 1.4* 1.9*    1/8 POC SARS coronavirus positive -Decadron 6 mg daily -Remdesivir per pharmacy protocol -Currently does not meet guidelines for use of Actemra -Vitamins per Covid protocol -Combivent -Flutter valve -Incentive spirometer -Prone 16 hours/day, if patient cannot tolerate it prone 2 to 3 hours per shift -Titrate O2 to maintain SPO2> 88%    DVT prophylaxis: Lovenox Code Status: Full Family Communication: 1/10 spoke with Vien (son) counseled on plan of care answered all questions  Disposition Plan: TBD   Consultants:    Procedures/Significant Events:     I have personally reviewed and interpreted all radiology studies and my findings are as above.  VENTILATOR SETTINGS: HFNC+ NRB 1/11 Flow; 15 L/min SPO2; 92%     Cultures 1/8 POC SARS coronavirus positive 1/8 HIV screen negative 1/8 blood pending    Antimicrobials:  Anti-infectives (From admission, onward)   Start     Dose/Rate Stop   06/08/19 1000  remdesivir 100 mg in sodium chloride 0.9 % 100 mL IVPB     100 mg 200 mL/hr over 30 Minutes 06/12/19 0959   06/07/19 1430  remdesivir 200 mg in sodium chloride 0.9% 250 mL IVPB     200 mg 580 mL/hr over 30 Minutes 06/08/19 0952       Devices    LINES / TUBES:      Continuous Infusions: . sodium chloride    . remdesivir 100 mg in NS 100 mL 100 mg (06/10/19 1014)     Objective: Vitals:   06/10/19 0034 06/10/19 0430 06/10/19 0536 06/10/19 0756  BP: 104/75 105/70  107/70  Pulse: 75 86 70 98  Resp: (!) 23 (!) 29 (!) 23 (!) 22  Temp: 97.7 F (36.5 C) 98.1 F (36.7 C)  97.6 F (36.4 C)  TempSrc: Axillary Axillary  Oral  SpO2: 97% 90% 95% 91%  Weight:      Height:        Intake/Output Summary (Last 24 hours) at 06/10/2019 1058 Last data filed at 06/10/2019 0801 Gross per 24 hour  Intake 583 ml  Output 200 ml  Net 383 ml   Filed Weights   06/08/19 1400  Weight: 57.9 kg    Physical Exam:  General: A/O x4, positive  acute respiratory distress Eyes: negative scleral hemorrhage, negative anisocoria, negative icterus ENT: Negative Runny nose, negative gingival bleeding, Neck:  Negative scars, masses, torticollis, lymphadenopathy, JVD Lungs: Tachypneic decreased breath sounds bilaterally without wheezes or crackles Cardiovascular: Regular rate and rhythm without murmur gallop or rub normal S1 and S2 Abdomen: negative abdominal pain, nondistended, positive soft, bowel sounds, no rebound, no ascites, no appreciable mass Extremities: No significant cyanosis, clubbing, or edema bilateral lower extremities Skin: Negative rashes, lesions, ulcers Psychiatric:  Negative depression, negative anxiety, negative fatigue, negative mania  Central nervous system:  Cranial nerves II through XII intact, tongue/uvula midline, all extremities muscle strength 5/5, sensation intact throughout, negative  dysarthria, negative expressive aphasia, negative receptive aphasia. .     Data Reviewed: Care during the described time interval was provided by me .  I have reviewed this patient's available data, including medical history, events of note, physical examination, and all test results as part of my evaluation.   CBC: Recent Labs  Lab 06/07/19 1008 06/08/19 0627 06/09/19 0232 06/10/19 0424  WBC 5.4 4.6 8.6 10.6*  NEUTROABS 3.7 3.1 6.9 8.9*  HGB 13.2 12.5 12.5 12.4  HCT 40.4 38.6 38.6 38.3  MCV 91.2 91.9 92.1 92.5  PLT 199 214 252 607   Basic Metabolic Panel: Recent Labs  Lab 06/07/19 1008 06/08/19 0627 06/09/19 0232 06/10/19 0424  NA 135 140 140 141  K 3.1* 4.7 4.3 4.1  CL 98 105 106 105  CO2 26 26 25 24   GLUCOSE 123* 130* 116* 98  BUN 21 30* 32* 26*  CREATININE 0.69 0.68 0.59 0.62  CALCIUM 7.7* 8.0* 8.1* 8.2*  MG  --  2.7* 2.3 2.1  PHOS  --  2.6 3.0 3.0   GFR: Estimated Creatinine Clearance: 58.9 mL/min (by C-G formula based on SCr of 0.62 mg/dL). Liver Function Tests: Recent Labs  Lab 06/07/19 1008 06/08/19 0627 06/09/19 0232 06/10/19 0424  AST 50* 39 31 24  ALT 36 31 30 24   ALKPHOS 51 49 50 49  BILITOT 1.2 0.4 0.5 0.6  PROT 6.9 6.4* 6.3* 6.3*  ALBUMIN 2.9* 2.7* 2.7* 2.6*   No results for input(s): LIPASE, AMYLASE in the last 168 hours. No results for input(s): AMMONIA in the last 168 hours. Coagulation Profile: No results for input(s): INR, PROTIME in the last 168 hours. Cardiac Enzymes: No results for input(s): CKTOTAL, CKMB, CKMBINDEX, TROPONINI in the last 168 hours. BNP (last 3 results) No results for input(s): PROBNP in the last 8760 hours. HbA1C: No results for input(s): HGBA1C in the last 72 hours. CBG: No results for input(s): GLUCAP in the last 168 hours. Lipid Profile: No results for input(s): CHOL, HDL, LDLCALC, TRIG, CHOLHDL, LDLDIRECT in the last 72 hours. Thyroid Function Tests: No results for input(s): TSH, T4TOTAL, FREET4, T3FREE,  THYROIDAB in the last 72 hours. Anemia Panel: Recent Labs    06/09/19 0232 06/10/19 0424  FERRITIN 427* 381*   Urine analysis:    Component Value Date/Time   BILIRUBINUR negative 08/15/2017 1058   KETONESUR negative 08/15/2017 1058   PROTEINUR negative 08/15/2017 1058   UROBILINOGEN 0.2 08/15/2017 1058   NITRITE Negative 08/15/2017 1058   LEUKOCYTESUR Negative 08/15/2017 1058   Sepsis Labs: @LABRCNTIP (procalcitonin:4,lacticidven:4)  ) Recent Results (from the past 240 hour(s))  Blood Culture (routine x 2)     Status: None (Preliminary result)   Collection Time: 06/07/19 10:08 AM   Specimen: BLOOD  Result Value Ref Range Status   Specimen Description   Final    BLOOD LEFT ANTECUBITAL Performed at [facility], [place] [address] [place], [place] [address]  Special Requests   Final    BOTTLES DRAWN AEROBIC AND ANAEROBIC Blood Culture adequate volume Performed at [facility], [address] [place], [place] [address]    Culture   Final    NO GROWTH 3 DAYS Performed at [facility], [address] [place], [place] [address]    Report Status PENDING  Incomplete  Blood Culture (routine x 2)     Status: None (Preliminary result)   Collection Time: 06/07/19 10:53 AM   Specimen: BLOOD  Result Value Ref Range Status   Specimen Description   Final    BLOOD RIGHT ANTECUBITAL Performed at [facility], [address] [place], [place] [address]    Special Requests   Final    BOTTLES DRAWN AEROBIC AND ANAEROBIC Blood Culture adequate volume Performed at [facility], [address], [place], [place] [address]    Culture   Final    NO GROWTH 3 DAYS Performed at [facility], [address], [place], [place] [address]    Report Status PENDING  Incomplete         Radiology Studies: No results found.      Scheduled Meds: . vitamin C  500 mg Oral Daily  . dexamethasone  (DECADRON) injection  6 mg Intravenous Q24H  . enoxaparin (LOVENOX) injection  40 mg Subcutaneous Q24H  . Ipratropium-Albuterol  1 puff Inhalation QID  . mouth rinse  15 mL Mouth Rinse BID  . sodium chloride flush  3 mL Intravenous Q12H  . sodium chloride flush  3 mL Intravenous Q12H  . zinc sulfate  220 mg Oral Daily   Continuous Infusions: . sodium chloride    . remdesivir 100 mg in NS 100 mL 100 mg (06/10/19 1014)     LOS: 3 days   The patient is critically ill with multiple organ systems failure and requires high complexity decision making for assessment and support, frequent evaluation and titration of therapies, application of advanced monitoring technologies and extensive interpretation of multiple databases. Critical Care Time devoted to patient care services described in this note  Time spent: 40 minutes     Lucely Leard, Roselind Messier, MD [facility] Pager [phone]  If 7PM-7AM, please contact night-coverage [email or web address] Password [facility] 06/10/2019, 10:58 AM

## 2019-06-10 NOTE — Plan of Care (Signed)
Patient remains alert, Respiratory status remains the same, Still requiring high levels of 02 in order to maintain sats, Continue Plan of care, Wean oxygen when able Problem: Activity: Goal: Ability to tolerate increased activity will improve Outcome: Not Progressing   Problem: Clinical Measurements: Goal: Ability to maintain a body temperature in the normal range will improve Outcome: Not Progressing   Problem: Respiratory: Goal: Ability to maintain adequate ventilation will improve Outcome: Not Progressing Goal: Ability to maintain a clear airway will improve Outcome: Not Progressing   Problem: Education: Goal: Knowledge of risk factors and measures for prevention of condition will improve Outcome: Not Progressing   Problem: Coping: Goal: Psychosocial and spiritual needs will be supported Outcome: Not Progressing   Problem: Respiratory: Goal: Will maintain a patent airway Outcome: Not Progressing Goal: Complications related to the disease process, condition or treatment will be avoided or minimized Outcome: Not Progressing   Problem: Education: Goal: Knowledge of General Education information will improve Description: Including pain rating scale, medication(s)/side effects and non-pharmacologic comfort measures Outcome: Not Progressing   Problem: Health Behavior/Discharge Planning: Goal: Ability to manage health-related needs will improve Outcome: Not Progressing   Problem: Clinical Measurements: Goal: Ability to maintain clinical measurements within normal limits will improve Outcome: Not Progressing Goal: Will remain free from infection Outcome: Not Progressing Goal: Diagnostic test results will improve Outcome: Not Progressing Goal: Respiratory complications will improve Outcome: Not Progressing Goal: Cardiovascular complication will be avoided Outcome: Not Progressing   Problem: Activity: Goal: Risk for activity intolerance will decrease Outcome: Not  Progressing   Problem: Nutrition: Goal: Adequate nutrition will be maintained Outcome: Not Progressing   Problem: Coping: Goal: Level of anxiety will decrease Outcome: Not Progressing   Problem: Elimination: Goal: Will not experience complications related to bowel motility Outcome: Not Progressing Goal: Will not experience complications related to urinary retention Outcome: Not Progressing   Problem: Pain Managment: Goal: General experience of comfort will improve Outcome: Not Progressing   Problem: Safety: Goal: Ability to remain free from injury will improve Outcome: Not Progressing   Problem: Skin Integrity: Goal: Risk for impaired skin integrity will decrease Outcome: Not Progressing

## 2019-06-10 NOTE — Plan of Care (Signed)
Patient able to be weaned down to 9L HFNC with sats >87, able to maintain sats when up to [facility], Requesting to oxygen be removed completely, remove once & sats dipped into seventies after a few moments but slower than before, inquiring about possible D/c date, 1x dose of Remdesivir remaining, continue plan of care  Patient has gotten to point that she is refusing to wear o2 properly, took approx. 10 minutes of coaxing patient to wear oxygen remotely close to her nose, o2 bumped back up to 15L HF in order to maintain sats.   Problem: Activity: Goal: Ability to tolerate increased activity will improve Outcome: Progressing   Problem: Respiratory: Goal: Ability to maintain adequate ventilation will improve Outcome: Progressing Goal: Ability to maintain a clear airway will improve Outcome: Progressing   Problem: Respiratory: Goal: Will maintain a patent airway Outcome: Progressing Goal: Complications related to the disease process, condition or treatment will be avoided or minimized Outcome: Progressing   Problem: Education: Goal: Knowledge of General Education information will improve Description: Including pain rating scale, medication(s)/side effects and non-pharmacologic comfort measures Outcome: Progressing   Problem: Health Behavior/Discharge Planning: Goal: Ability to manage health-related needs will improve Outcome: Progressing   Problem: Clinical Measurements: Goal: Ability to maintain clinical measurements within normal limits will improve Outcome: Progressing Goal: Will remain free from infection Outcome: Progressing Goal: Diagnostic test results will improve Outcome: Progressing Goal: Respiratory complications will improve Outcome: Progressing   Problem: Activity: Goal: Risk for activity intolerance will decrease Outcome: Progressing   Problem: Nutrition: Goal: Adequate nutrition will be maintained Outcome: Progressing   Problem: Safety: Goal: Ability to remain  free from injury will improve Outcome: Progressing   Problem: Education: Goal: Knowledge of risk factors and measures for prevention of condition will improve Outcome: Completed/Met   Problem: Coping: Goal: Psychosocial and spiritual needs will be supported Outcome: Completed/Met   Problem: Clinical Measurements: Goal: Cardiovascular complication will be avoided Outcome: Completed/Met   Problem: Elimination: Goal: Will not experience complications related to bowel motility Outcome: Completed/Met Goal: Will not experience complications related to urinary retention Outcome: Completed/Met   Problem: Pain Managment: Goal: General experience of comfort will improve Outcome: Completed/Met   Problem: Skin Integrity: Goal: Risk for impaired skin integrity will decrease Outcome: Completed/Met

## 2019-06-11 LAB — CBC WITH DIFFERENTIAL/PLATELET
Abs Immature Granulocytes: 0.1 10*3/uL — ABNORMAL HIGH (ref 0.00–0.07)
Basophils Absolute: 0 10*3/uL (ref 0.0–0.1)
Basophils Relative: 0 %
Eosinophils Absolute: 0.1 10*3/uL (ref 0.0–0.5)
Eosinophils Relative: 1 %
HCT: 40.8 % (ref 36.0–46.0)
Hemoglobin: 13.2 g/dL (ref 12.0–15.0)
Immature Granulocytes: 1 %
Lymphocytes Relative: 14 %
Lymphs Abs: 1.3 10*3/uL (ref 0.7–4.0)
MCH: 29.8 pg (ref 26.0–34.0)
MCHC: 32.4 g/dL (ref 30.0–36.0)
MCV: 92.1 fL (ref 80.0–100.0)
Monocytes Absolute: 0.3 10*3/uL (ref 0.1–1.0)
Monocytes Relative: 3 %
Neutro Abs: 7.8 10*3/uL — ABNORMAL HIGH (ref 1.7–7.7)
Neutrophils Relative %: 81 %
Platelets: 301 10*3/uL (ref 150–400)
RBC: 4.43 MIL/uL (ref 3.87–5.11)
RDW: 13.4 % (ref 11.5–15.5)
WBC: 9.6 10*3/uL (ref 4.0–10.5)
nRBC: 0 % (ref 0.0–0.2)

## 2019-06-11 LAB — D-DIMER, QUANTITATIVE: D-Dimer, Quant: 6.45 ug/mL-FEU — ABNORMAL HIGH (ref 0.00–0.50)

## 2019-06-11 LAB — PHOSPHORUS: Phosphorus: 2.8 mg/dL (ref 2.5–4.6)

## 2019-06-11 LAB — COMPREHENSIVE METABOLIC PANEL
ALT: 25 U/L (ref 0–44)
AST: 28 U/L (ref 15–41)
Albumin: 2.8 g/dL — ABNORMAL LOW (ref 3.5–5.0)
Alkaline Phosphatase: 51 U/L (ref 38–126)
Anion gap: 13 (ref 5–15)
BUN: 24 mg/dL — ABNORMAL HIGH (ref 8–23)
CO2: 26 mmol/L (ref 22–32)
Calcium: 8.4 mg/dL — ABNORMAL LOW (ref 8.9–10.3)
Chloride: 102 mmol/L (ref 98–111)
Creatinine, Ser: 0.59 mg/dL (ref 0.44–1.00)
GFR calc Af Amer: >60 mL/min (ref >60)
GFR calc non Af Amer: >60 mL/min (ref >60)
Glucose, Bld: 107 mg/dL — ABNORMAL HIGH (ref 70–99)
Potassium: 3.7 mmol/L (ref 3.5–5.1)
Sodium: 141 mmol/L (ref 135–145)
Total Bilirubin: 0.7 mg/dL (ref 0.3–1.2)
Total Protein: 6.6 g/dL (ref 6.5–8.1)

## 2019-06-11 LAB — MAGNESIUM: Magnesium: 2.1 mg/dL (ref 1.7–2.4)

## 2019-06-11 LAB — FERRITIN: Ferritin: 412 ng/mL — ABNORMAL HIGH (ref 11–307)

## 2019-06-11 LAB — C-REACTIVE PROTEIN: CRP: 7.4 mg/dL — ABNORMAL HIGH (ref <1.0)

## 2019-06-11 MED ORDER — TOCILIZUMAB 400 MG/20ML IV SOLN
400.0000 mg | Freq: Once | INTRAVENOUS | Status: AC
  Administered 2019-06-11: 400 mg via INTRAVENOUS
  Filled 2019-06-11: qty 20

## 2019-06-11 NOTE — Plan of Care (Signed)
  Problem: Respiratory: Goal: Ability to maintain adequate ventilation will improve Outcome: Progressing Goal: Ability to maintain a clear airway will improve Outcome: Progressing  Pt on 15L HFNC.  Pt constantly removes oxygen from nose.  Reinforced importance of keeping oxygen in nose at all times via interpreter.

## 2019-06-11 NOTE — Progress Notes (Addendum)
PROGRESS NOTE    Alexis Hickman  ERX:[id number] DOB: 1960/10/05 DOA: 06/07/2019 PCP: Patient, No Pcp Per   Brief Narrative:  Angle Dirusso is a 59 y.o.  [place] speaking female PMHx none  Presenting with worsening COVID-19 symptoms.  Patient tested positive for COVID-19 on 12/29.  Patient's son also has COVID and she reports that he gave it to her.  She developed weakness and fatigue and tested positive about 10 days ago.  Over the last day or so, she has had progressive SOB.  She is currently on 15L NRB O2 with normal O2 sats and is primarily concerning of hunger.  History was obtained through the teleinterpreter.   Subjective: 1/12 afebrile last 24 hours A/O x4, continue high O2 demand.  Positive S OB.   Assessment & Plan:   Principal Problem:   Acute hypoxemic respiratory failure due to COVID-19 [facility])   Covid pneumonia/acute respiratory failure with hypoxia COVID-19 Labs  Recent Labs    06/09/19 0232 06/10/19 0424 06/11/19 0432  DDIMER 5.12* 5.37* 6.45*  FERRITIN 427* 381* 412*  CRP 1.4* 1.9* 7.4*    1/8 POC SARS coronavirus positive -Decadron 6 mg daily -Remdesivir per pharmacy protocol -Currently does not meet guidelines for use of Actemra -Vitamins per Covid protocol -Combivent -Flutter valve -Incentive spirometer -Prone 16 hours/day, if patient cannot tolerate it prone 2 to 3 hours per shift -Titrate O2 to maintain SPO2> 88% -Through [place] interpreter-Patient does not have any exclusion criteria for Actemra.  Risks/benefits reviewed with patient who agrees to receive Actemra -1/12 Actemra x1 dose   DVT prophylaxis: Lovenox Code Status: Full Family Communication: 1/12 spoke with Vien (son) counseled on plan of care answered all questions  Disposition Plan: TBD   Consultants:    Procedures/Significant Events:  1/12 Actemra x1 dose   I have personally reviewed and interpreted all radiology studies and my findings are as above.  VENTILATOR SETTINGS: HFNC  1/12 Flow; 11 L/min  SPO2; 95%     Cultures 1/8 POC SARS coronavirus positive 1/8 HIV screen negative 1/8 blood pending    Antimicrobials: Anti-infectives (From admission, onward)   Start     Dose/Rate Stop   06/08/19 1000  remdesivir 100 mg in sodium chloride 0.9 % 100 mL IVPB     100 mg 200 mL/hr over 30 Minutes 06/12/19 0959   06/07/19 1430  remdesivir 200 mg in sodium chloride 0.9% 250 mL IVPB     200 mg 580 mL/hr over 30 Minutes 06/08/19 0952       Devices    LINES / TUBES:      Continuous Infusions: . sodium chloride       Objective: Vitals:   06/11/19 0130 06/11/19 0200 06/11/19 0441 06/11/19 0730  BP:   100/72 102/75  Pulse:   97 100  Resp:   18 (!) 22  Temp:   98.3 F (36.8 C) 98.9 F (37.2 C)  TempSrc:    Oral  SpO2: (!) 81% 94% 91% 91%  Weight:      Height:        Intake/Output Summary (Last 24 hours) at 06/11/2019 1109 Last data filed at 06/11/2019 9509 Gross per 24 hour  Intake 243 ml  Output 350 ml  Net -107 ml   Filed Weights   06/08/19 1400  Weight: 57.9 kg   Physical Exam:  General: A/O x4, positive acute respiratory distress Eyes: negative scleral hemorrhage, negative anisocoria, negative icterus ENT: Negative Runny nose, negative gingival bleeding, Neck:  Negative  scars, masses, torticollis, lymphadenopathy, JVD Lungs: Decreased breath sounds bilaterally without wheezes or crackles Cardiovascular: Tachycardic without murmur gallop or rub normal S1 and S2 Abdomen: negative abdominal pain, nondistended, positive soft, bowel sounds, no rebound, no ascites, no appreciable mass Extremities: No significant cyanosis, clubbing, or edema bilateral lower extremities Skin: Negative rashes, lesions, ulcers Psychiatric:  Negative depression, negative anxiety, negative fatigue, negative mania  Central nervous system:  Cranial nerves II through XII intact, tongue/uvula midline, all extremities muscle strength 5/5, sensation intact  throughout,  negative dysarthria, negative expressive aphasia, negative receptive aphasia.    Data Reviewed: Care during the described time interval was provided by me .  I have reviewed this patient's available data, including medical history, events of note, physical examination, and all test results as part of my evaluation.   CBC: Recent Labs  Lab 06/07/19 1008 06/08/19 0627 06/09/19 0232 06/10/19 0424 06/11/19 0432  WBC 5.4 4.6 8.6 10.6* 9.6  NEUTROABS 3.7 3.1 6.9 8.9* 7.8*  HGB 13.2 12.5 12.5 12.4 13.2  HCT 40.4 38.6 38.6 38.3 40.8  MCV 91.2 91.9 92.1 92.5 92.1  PLT 199 214 252 269 301   Basic Metabolic Panel: Recent Labs  Lab 06/07/19 1008 06/08/19 0627 06/09/19 0232 06/10/19 0424 06/11/19 0432  NA 135 140 140 141 141  K 3.1* 4.7 4.3 4.1 3.7  CL 98 105 106 105 102  CO2 26 26 25 24 26   GLUCOSE 123* 130* 116* 98 107*  BUN 21 30* 32* 26* 24*  CREATININE 0.69 0.68 0.59 0.62 0.59  CALCIUM 7.7* 8.0* 8.1* 8.2* 8.4*  MG  --  2.7* 2.3 2.1 2.1  PHOS  --  2.6 3.0 3.0 2.8   GFR: Estimated Creatinine Clearance: 58.9 mL/min (by C-G formula based on SCr of 0.59 mg/dL). Liver Function Tests: Recent Labs  Lab 06/07/19 1008 06/08/19 0627 06/09/19 0232 06/10/19 0424 06/11/19 0432  AST 50* 39 31 24 28   ALT 36 31 30 24 25   ALKPHOS 51 49 50 49 51  BILITOT 1.2 0.4 0.5 0.6 0.7  PROT 6.9 6.4* 6.3* 6.3* 6.6  ALBUMIN 2.9* 2.7* 2.7* 2.6* 2.8*   No results for input(s): LIPASE, AMYLASE in the last 168 hours. No results for input(s): AMMONIA in the last 168 hours. Coagulation Profile: No results for input(s): INR, PROTIME in the last 168 hours. Cardiac Enzymes: No results for input(s): CKTOTAL, CKMB, CKMBINDEX, TROPONINI in the last 168 hours. BNP (last 3 results) No results for input(s): PROBNP in the last 8760 hours. HbA1C: No results for input(s): HGBA1C in the last 72 hours. CBG: No results for input(s): GLUCAP in the last 168 hours. Lipid Profile: No results for  input(s): CHOL, HDL, LDLCALC, TRIG, CHOLHDL, LDLDIRECT in the last 72 hours. Thyroid Function Tests: No results for input(s): TSH, T4TOTAL, FREET4, T3FREE, THYROIDAB in the last 72 hours. Anemia Panel: Recent Labs    06/10/19 0424 06/11/19 0432  FERRITIN 381* 412*   Urine analysis:    Component Value Date/Time   BILIRUBINUR negative 08/15/2017 1058   KETONESUR negative 08/15/2017 1058   PROTEINUR negative 08/15/2017 1058   UROBILINOGEN 0.2 08/15/2017 1058   NITRITE Negative 08/15/2017 1058   LEUKOCYTESUR Negative 08/15/2017 1058   Sepsis Labs: @LABRCNTIP (procalcitonin:4,lacticidven:4)  ) Recent Results (from the past 240 hour(s))  Blood Culture (routine x 2)     Status: None (Preliminary result)   Collection Time: 06/07/19 10:08 AM   Specimen: BLOOD  Result Value Ref Range Status   Specimen Description  Final    BLOOD LEFT ANTECUBITAL Performed at [facility], [address] [place], [place] [address]    Special Requests   Final    BOTTLES DRAWN AEROBIC AND ANAEROBIC Blood Culture adequate volume Performed at [facility], [address], [place], [place] [address]    Culture   Final    NO GROWTH 3 DAYS Performed at [facility], [address], [place], [place] [address]    Report Status PENDING  Incomplete  Blood Culture (routine x 2)     Status: None (Preliminary result)   Collection Time: 06/07/19 10:53 AM   Specimen: BLOOD  Result Value Ref Range Status   Specimen Description   Final    BLOOD RIGHT ANTECUBITAL Performed at [facility], [address], [place], [place] [address]    Special Requests   Final    BOTTLES DRAWN AEROBIC AND ANAEROBIC Blood Culture adequate volume Performed at [facility], [address], [place], [place] [address]    Culture   Final    NO GROWTH 3 DAYS Performed at [facility], [address] [place], [place] [address]    Report  Status PENDING  Incomplete         Radiology Studies: No results found.      Scheduled Meds: . vitamin C  500 mg Oral Daily  . dexamethasone (DECADRON) injection  6 mg Intravenous Q24H  . enoxaparin (LOVENOX) injection  40 mg Subcutaneous Q24H  . Ipratropium-Albuterol  1 puff Inhalation QID  . mouth rinse  15 mL Mouth Rinse BID  . sodium chloride flush  3 mL Intravenous Q12H  . sodium chloride flush  3 mL Intravenous Q12H  . zinc sulfate  220 mg Oral Daily   Continuous Infusions: . sodium chloride       LOS: 4 days   The patient is critically ill with multiple organ systems failure and requires high complexity decision making for assessment and support, frequent evaluation and titration of therapies, application of advanced monitoring technologies and extensive interpretation of multiple databases. Critical Care Time devoted to patient care services described in this note  Time spent: 40 minutes     Britlyn Martine, Roselind Messier, MD [facility] Pager [phone]  If 7PM-7AM, please contact night-coverage [email or web address] Password [facility] 06/11/2019, 11:09 AM

## 2019-06-12 LAB — CBC WITH DIFFERENTIAL/PLATELET
Abs Immature Granulocytes: 0.07 10*3/uL (ref 0.00–0.07)
Basophils Absolute: 0 10*3/uL (ref 0.0–0.1)
Basophils Relative: 0 %
Eosinophils Absolute: 0.1 10*3/uL (ref 0.0–0.5)
Eosinophils Relative: 1 %
HCT: 39.9 % (ref 36.0–46.0)
Hemoglobin: 13.2 g/dL (ref 12.0–15.0)
Immature Granulocytes: 1 %
Lymphocytes Relative: 22 %
Lymphs Abs: 1.3 10*3/uL (ref 0.7–4.0)
MCH: 30.1 pg (ref 26.0–34.0)
MCHC: 33.1 g/dL (ref 30.0–36.0)
MCV: 91.1 fL (ref 80.0–100.0)
Monocytes Absolute: 0.4 10*3/uL (ref 0.1–1.0)
Monocytes Relative: 7 %
Neutro Abs: 3.9 10*3/uL (ref 1.7–7.7)
Neutrophils Relative %: 69 %
Platelets: 320 10*3/uL (ref 150–400)
RBC: 4.38 MIL/uL (ref 3.87–5.11)
RDW: 13.5 % (ref 11.5–15.5)
WBC: 5.7 10*3/uL (ref 4.0–10.5)
nRBC: 0 % (ref 0.0–0.2)

## 2019-06-12 LAB — CULTURE, BLOOD (ROUTINE X 2)
Culture: NO GROWTH
Culture: NO GROWTH
Special Requests: ADEQUATE
Special Requests: ADEQUATE

## 2019-06-12 LAB — COMPREHENSIVE METABOLIC PANEL
ALT: 22 U/L (ref 0–44)
AST: 20 U/L (ref 15–41)
Albumin: 2.6 g/dL — ABNORMAL LOW (ref 3.5–5.0)
Alkaline Phosphatase: 49 U/L (ref 38–126)
Anion gap: 8 (ref 5–15)
BUN: 23 mg/dL (ref 8–23)
CO2: 25 mmol/L (ref 22–32)
Calcium: 8 mg/dL — ABNORMAL LOW (ref 8.9–10.3)
Chloride: 106 mmol/L (ref 98–111)
Creatinine, Ser: 0.6 mg/dL (ref 0.44–1.00)
GFR calc Af Amer: >60 mL/min (ref >60)
GFR calc non Af Amer: >60 mL/min (ref >60)
Glucose, Bld: 104 mg/dL — ABNORMAL HIGH (ref 70–99)
Potassium: 3.9 mmol/L (ref 3.5–5.1)
Sodium: 139 mmol/L (ref 135–145)
Total Bilirubin: 0.5 mg/dL (ref 0.3–1.2)
Total Protein: 6.3 g/dL — ABNORMAL LOW (ref 6.5–8.1)

## 2019-06-12 LAB — FERRITIN: Ferritin: 468 ng/mL — ABNORMAL HIGH (ref 11–307)

## 2019-06-12 LAB — PHOSPHORUS: Phosphorus: 2.9 mg/dL (ref 2.5–4.6)

## 2019-06-12 LAB — D-DIMER, QUANTITATIVE: D-Dimer, Quant: 2.88 ug/mL-FEU — ABNORMAL HIGH (ref 0.00–0.50)

## 2019-06-12 LAB — MAGNESIUM: Magnesium: 2.4 mg/dL (ref 1.7–2.4)

## 2019-06-12 LAB — C-REACTIVE PROTEIN: CRP: 6.1 mg/dL — ABNORMAL HIGH (ref <1.0)

## 2019-06-12 MED ORDER — IPRATROPIUM-ALBUTEROL 20-100 MCG/ACT IN AERS
1.0000 | INHALATION_SPRAY | Freq: Four times a day (QID) | RESPIRATORY_TRACT | Status: DC | PRN
  Filled 2019-06-12: qty 4

## 2019-06-12 MED ORDER — DEXAMETHASONE 6 MG PO TABS
6.0000 mg | ORAL_TABLET | Freq: Every day | ORAL | Status: DC
  Administered 2019-06-13: 6 mg via ORAL
  Filled 2019-06-12: qty 1

## 2019-06-12 NOTE — Progress Notes (Addendum)
Alexis Hickman  NID:[id number] DOB: 03-27-1961 DOA: 06/07/2019 PCP: Patient, No Pcp Per    Brief Narrative:  59 year old female who speaks [place] only with no significant past medical history who presented to the ED with worsening Covid symptoms after having a positive Covid test 12/29.  She complained primarily of progressive shortness of breath.  At presentation she required 15 L nonrebreather mask to stabilize her oxygenation.  Significant Events: 12/29+ Covid test  COVID-19 specific Treatment: Decadron 1/8 > Remdesivir 1/8 > 1/12 Actemra 1/12  Antimicrobials:  None  Subjective: Oxygen saturations 85-93% on 5 L high flow nasal cannula.  Very anxious to go home as soon as possible.  Denies any other complaints whatsoever.  I spoke to the patient at length using the iPad interpreter service.  Assessment & Plan:  COVID Pneumonia -acute hypoxic respiratory failure Transition to conventional nasal cannula oxygen today -ambulate -monitor saturations with ambulation -push therapy/mobility -probable discharge home 1/14 with oxygen support  Recent Labs  Lab 06/07/19 1008 06/08/19 0627 06/09/19 0232 06/10/19 0424 06/11/19 0432 06/12/19 0516  DDIMER 1.33* 1.75* 5.12* 5.37* 6.45* 2.88*  FERRITIN 720* 493* 427* 381* 412* 468*  CRP 5.3* 3.1* 1.4* 1.9* 7.4* 6.1*  ALT 36 31 30 24 25 22   PROCALCITON 0.13  --   --   --   --   --      DVT prophylaxis: Lovenox Code Status: FULL CODE Family Communication:  Disposition Plan:   Consultants:  none  Objective: Blood pressure 105/77, pulse 81, temperature 98.2 F (36.8 C), temperature source Oral, resp. rate (!) 23, height 5' (1.524 m), weight 57.9 kg, SpO2 94 %.  Intake/Output Summary (Last 24 hours) at 06/12/2019 0902 Last data filed at 06/12/2019 1443 Gross per 24 hour  Intake 3 ml  Output 375 ml  Net -372 ml   Filed Weights   06/08/19 1400  Weight: 57.9 kg    Examination: General: No acute respiratory distress Lungs: Fine  crackles diffusely with no wheezing Cardiovascular: Regular rate and rhythm without murmur gallop or rub normal S1 and S2 Abdomen: Nontender, nondistended, soft, bowel sounds positive, no rebound, no ascites, no appreciable mass Extremities: No significant cyanosis, clubbing, or edema bilateral lower extremities  CBC: Recent Labs  Lab 06/10/19 0424 06/11/19 0432 06/12/19 0516  WBC 10.6* 9.6 5.7  NEUTROABS 8.9* 7.8* 3.9  HGB 12.4 13.2 13.2  HCT 38.3 40.8 39.9  MCV 92.5 92.1 91.1  PLT 269 301 154   Basic Metabolic Panel: Recent Labs  Lab 06/10/19 0424 06/11/19 0432 06/12/19 0516  NA 141 141 139  K 4.1 3.7 3.9  CL 105 102 106  CO2 24 26 25   GLUCOSE 98 107* 104*  BUN 26* 24* 23  CREATININE 0.62 0.59 0.60  CALCIUM 8.2* 8.4* 8.0*  MG 2.1 2.1 2.4  PHOS 3.0 2.8 2.9   GFR: Estimated Creatinine Clearance: 58.9 mL/min (by C-G formula based on SCr of 0.6 mg/dL).  Liver Function Tests: Recent Labs  Lab 06/09/19 0232 06/10/19 0424 06/11/19 0432 06/12/19 0516  AST 31 24 28 20   ALT 30 24 25 22   ALKPHOS 50 49 51 49  BILITOT 0.5 0.6 0.7 0.5  PROT 6.3* 6.3* 6.6 6.3*  ALBUMIN 2.7* 2.6* 2.8* 2.6*     Recent Results (from the past 240 hour(s))  Blood Culture (routine x 2)     Status: None (Preliminary result)   Collection Time: 06/07/19 10:08 AM   Specimen: BLOOD  Result Value Ref Range Status  Specimen Description   Final    BLOOD LEFT ANTECUBITAL Performed at [facility], [address], [place], [place] [address]    Special Requests   Final    BOTTLES DRAWN AEROBIC AND ANAEROBIC Blood Culture adequate volume Performed at [facility], [address], [place], [place] [address]    Culture   Final    NO GROWTH 4 DAYS Performed at [facility], [address] [place], [place] [address]    Report Status PENDING  Incomplete  Blood Culture (routine x 2)     Status: None (Preliminary result)   Collection Time: 06/07/19  10:53 AM   Specimen: BLOOD  Result Value Ref Range Status   Specimen Description   Final    BLOOD RIGHT ANTECUBITAL Performed at [facility], [address], [place], [place] [address]    Special Requests   Final    BOTTLES DRAWN AEROBIC AND ANAEROBIC Blood Culture adequate volume Performed at [facility], [address] [place], [place] [address]    Culture   Final    NO GROWTH 4 DAYS Performed at [facility], [address] [place], [place] [address]    Report Status PENDING  Incomplete     Scheduled Meds: . vitamin C  500 mg Oral Daily  . dexamethasone (DECADRON) injection  6 mg Intravenous Q24H  . enoxaparin (LOVENOX) injection  40 mg Subcutaneous Q24H  . Ipratropium-Albuterol  1 puff Inhalation QID  . mouth rinse  15 mL Mouth Rinse BID  . sodium chloride flush  3 mL Intravenous Q12H  . sodium chloride flush  3 mL Intravenous Q12H  . zinc sulfate  220 mg Oral Daily   Continuous Infusions: . sodium chloride       LOS: 5 days   Lonia Blood, MD [facility] Office  [phone] Pager - Text Page per Loretha Stapler  If 7PM-7AM, please contact night-coverage per Amion 06/12/2019, 9:02 AM

## 2019-06-12 NOTE — Plan of Care (Signed)
SATURATION QUALIFICATIONS: (This note is used to comply with regulatory documentation for home oxygen)  Patient Saturations on Room Air at Rest = 92%  Patient Saturations on Room Air while Ambulating = 82%  Patient Saturations on 10 Liters of oxygen while Ambulating = 91%  Please briefly explain why patient needs home oxygen:  Pt at rest no signs of SOB.  Pt began ambulating 40 ft on room air and desaturated to 82%.  RN placed pt on 10L [place] and sats increased to 91%.  Pt returned to room and placed back on 2LNC, sats 95%.  No signs of distress noted at this time.  RN will continue to monitor/assess pt

## 2019-06-12 NOTE — Plan of Care (Signed)
Patient more alert and talkative today than previous days, able to wean 02 down to 6L HFNC, finished both Remdesivir and Actemra tx, Patient eagerly waiting D/c date at this time, safety precautions maintained, continue poc. Problem: Activity: Goal: Ability to tolerate increased activity will improve Outcome: Progressing   Problem: Clinical Measurements: Goal: Ability to maintain a body temperature in the normal range will improve Outcome: Progressing   Problem: Respiratory: Goal: Ability to maintain adequate ventilation will improve Outcome: Progressing Goal: Ability to maintain a clear airway will improve Outcome: Progressing   Problem: Respiratory: Goal: Will maintain a patent airway Outcome: Progressing Goal: Complications related to the disease process, condition or treatment will be avoided or minimized Outcome: Progressing   Problem: Education: Goal: Knowledge of General Education information will improve Description: Including pain rating scale, medication(s)/side effects and non-pharmacologic comfort measures Outcome: Progressing   Problem: Health Behavior/Discharge Planning: Goal: Ability to manage health-related needs will improve Outcome: Progressing   Problem: Clinical Measurements: Goal: Ability to maintain clinical measurements within normal limits will improve Outcome: Progressing Goal: Will remain free from infection Outcome: Progressing Goal: Diagnostic test results will improve Outcome: Progressing Goal: Respiratory complications will improve Outcome: Progressing   Problem: Activity: Goal: Risk for activity intolerance will decrease Outcome: Progressing   Problem: Nutrition: Goal: Adequate nutrition will be maintained Outcome: Progressing   Problem: Coping: Goal: Level of anxiety will decrease Outcome: Progressing   Problem: Safety: Goal: Ability to remain free from injury will improve Outcome: Progressing

## 2019-06-13 MED ORDER — DEXAMETHASONE 6 MG PO TABS: 6.0000 mg | ORAL_TABLET | Freq: Every day | ORAL | 0 refills | Status: AC

## 2019-06-13 NOTE — Plan of Care (Signed)
  Problem: Respiratory: Goal: Ability to maintain a clear airway will improve Outcome: Progressing   Problem: Nutrition: Goal: Adequate nutrition will be maintained Outcome: Progressing   Problem: Respiratory: Goal: Ability to maintain adequate ventilation will improve Outcome: Not Progressing   Problem: Coping: Goal: Level of anxiety will decrease Outcome: Not Progressing

## 2019-06-13 NOTE — Progress Notes (Signed)
SATURATION QUALIFICATIONS: (This note is used to comply with regulatory documentation for home oxygen)  Patient Saturations on Room Air at Rest = 83%  Patient Saturations on Room Air while Ambulating = NT due to low SpO2 at rest  Patient Saturations on 4 Liters of oxygen while Ambulating = 92%  Please briefly explain why patient needs home oxygen:Needs supplemental O2 to maintain adequate SpO2 at rest and with activity.  Georga Hacking [facility] PT Acute Rehabilitation Services Pager [phone] Office [phone]

## 2019-06-13 NOTE — Evaluation (Signed)
Occupational Therapy Evaluation Patient Details Name: Alexis Hickman MRN: [id number] DOB: 1960/08/28 Today's Date: 06/13/2019    History of Present Illness 59 year old female who speaks [place] only with no significant past medical history who presented to the ED with COVID Pneumonia and acute hypoxic respiratory failure.   Clinical Impression   PTA Pt independent in ADL and mobility, works in [employer] (where she contracted [place]). Today she is min guard/supervision for ADL from both seated and standing positions - able to access LB and UB, perform standing tasks. Pt on 2L O2 at rest, and with activity requires 4L O2 with noticeable DOE 3/4. Extensive time taken with Pt and translator to explain importance of supplemental oxygen, why oxygen is important for brain and body function, energy conservation as she recovers, and quarantine for the safety of her family to prevent them from getting COVID. With pending discharge, She will not need HH follow up, intermittent supervision, and I am recommending a 3 in 1 to use as a shower chair for safety and energy conservation. I am concerned that there might be low health literacy, and so her family should understand the importance of her supplemental oxygen.    Follow Up Recommendations  No OT follow up;Supervision - Intermittent    Equipment Recommendations  3 in 1 bedside commode(to use as shower chair  - energy conservation)    Recommendations for Other Services       Precautions / Restrictions Precautions Precaution Comments: watch O2 Restrictions Weight Bearing Restrictions: No      Mobility Bed Mobility               General bed mobility comments: OOB in recliner at beginning and end of session  Transfers Overall transfer level: Needs assistance Equipment used: None Transfers: Sit to/from Stand Sit to Stand: Supervision;+2 safety/equipment         General transfer comment: +2 only to manage O2, Music therapist, and  patient safety for evaluation    Balance Overall balance assessment: No apparent balance deficits (not formally assessed)                                         ADL either performed or assessed with clinical judgement   ADL Overall ADL's : Needs assistance/impaired Eating/Feeding: Independent   Grooming: Supervision/safety;Standing Grooming Details (indicate cue type and reason): increased oxygen needs with activity Upper Body Bathing: Supervision/ safety   Lower Body Bathing: Min guard   Upper Body Dressing : Supervision/safety   Lower Body Dressing: Min guard   Toilet Transfer: Min guard;Ambulation Toilet Transfer Details (indicate cue type and reason): increased O2 needs with transfers, mobility, ambulation to bathroom Toileting- Clothing Manipulation and Hygiene: Supervision/safety;Sit to/from stand   Tub/ Shower Transfer: Min guard;Ambulation   Functional mobility during ADLs: Min guard(progressing towards supervision) General ADL Comments: with activity she requires increased O2 support to maintain SpO2 at or above 90%     Vision         Perception     Praxis      Pertinent Vitals/Pain Pain Assessment: No/denies pain     Hand Dominance Right   Extremity/Trunk Assessment Upper Extremity Assessment Upper Extremity Assessment: Overall WFL for tasks assessed   Lower Extremity Assessment Lower Extremity Assessment: Defer to PT evaluation   Cervical / Trunk Assessment Cervical / Trunk Assessment: Normal   Communication Communication Communication: Prefers language other than  English;Interpreter utilized(#[id number] [place])   Cognition Arousal/Alertness: Awake/alert Behavior During Therapy: WFL for tasks assessed/performed Overall Cognitive Status: Within Functional Limits for tasks assessed                                 General Comments: suspect low health literacy   General Comments  Therapy team and translator took  significant time to explain importance of supplemental oxygen and energy conservation. Pt also educated on quarantine at home to prevent family members from getting COVID    Exercises     Shoulder Instructions      Home Living Family/patient expects to be discharged to:: Private residence Living Arrangements: Children Available Help at Discharge: Family Type of Home: Apartment Home Access: Stairs to enter Secretary/administrator of Steps: 2   Home Layout: One level     Bathroom Shower/Tub: Runner, broadcasting/film/video: None          Prior Functioning/Environment Level of Independence: Independent        Comments: works at [employer] List: Decreased activity tolerance;Cardiopulmonary status limiting activity      OT Treatment/Interventions:      OT Goals(Current goals can be found in the care plan section) Acute Rehab OT Goals Patient Stated Goal: to go home OT Goal Formulation: With patient Time For Goal Achievement: 06/27/19 Potential to Achieve Goals: Good  OT Frequency:     Barriers to D/C:            Co-evaluation PT/OT/SLP Co-Evaluation/Treatment: Yes Reason for Co-Treatment: Complexity of the patient's impairments (multi-system involvement);Other (comment)(equipment management, use of translator) PT goals addressed during session: Mobility/safety with mobility;Balance OT goals addressed during session: ADL's and self-care      AM-PAC OT "6 Clicks" Daily Activity     Outcome Measure Help from another person eating meals?: None Help from another person taking care of personal grooming?: None Help from another person toileting, which includes using toliet, bedpan, or urinal?: None Help from another person bathing (including washing, rinsing, drying)?: A Little Help from another person to put on and taking off regular upper body clothing?: None Help from another person to put on and taking off regular lower body  clothing?: None 6 Click Score: 23   End of Session Equipment Utilized During Treatment: Oxygen(2-4L) Nurse Communication: Mobility status;Other (comment)(SpO2)  Activity Tolerance: Patient tolerated treatment well Patient left: in chair;with call bell/phone within reach  OT Visit Diagnosis: Muscle weakness (generalized) (M62.81)                Time: 2458-0998 OT Time Calculation (min): 39 min Charges:  OT General Charges $OT Visit: 1 Visit OT Evaluation $OT Eval Moderate Complexity: 1 Mod OT Treatments $Self Care/Home Management : 8-22 mins  Nyoka Cowden OTR/L Acute Rehabilitation Services Pager: [phone] Office: [phone]   Vernona Rieger 06/13/2019, 1:26 PM

## 2019-06-13 NOTE — Progress Notes (Signed)
Pt education given to both to patient and her son Alexis Hickman.  Pt son speaks english well and able to understand discharge instructions including need to pick up dexamethasone from listed [place] and continuing O2 therapy.  Hickman, Alexis states oxygen concentrator delivered to his home with instructions for use and that he understands need for use.  Pt taken by w/c with belonings, picked up by son.

## 2019-06-13 NOTE — Discharge Instructions (Signed)
Ngy ki?m tra COVID d??ng tnh: 29/04/2019  Ngy k?t thc ki?m d?ch: 19/05/2019  COVID-19 COVID-19 l b?nh nhi?m trng ???ng h h?p do m?t lo?i vi rt tn l coronavirus gy h?i ch?ng h h?p c?p n?ng 2 (SARS-CoV-2) gy ra. B?nh ny cn ???c g?i l b?nh do coronavirus ho?c coronavirus ch?ng m?i. ? m?t s? ng??i, vi rt ny c th? khng gy ra b?t k? tri?u ch?ng no. ? nh?ng ng??i khc, n c th? gy nhi?m trng nghim tr?ng. Nhi?m trng c th? nhanh chng tr? nn tr?m tr?ng h?n v c th? d?n ??n cc bi?n ch?ng, ch?ng h?n nh?:  Vim ph?i, ho?c nhi?m trng ph?i.  H?i ch?ng suy h h?p c?p hay ARDS. ?y l tnh tr?ng d?ch tch t? trong ph?i ng?n khng cho ph?i n?p ??y khng kh v ??a oxy vo mu.  Suy h h?p c?p. ?y l tnh tr?ng trong ? khng c ?? oxy ?i t? ph?i ??n c? th? ho?c khi carbon dioxide khng ?i t? ph?i ra kh?i c? th?.  Nhi?m trng huy?t ho?c s?c nhi?m khu?n. ?y l ph?n ?ng nghim tr?ng c?a c? th? v?i nhi?m trng.  Cc v?n ?? ?ng mu.  Nhi?m trng th? pht do vi khu?n ho?c n?m.  Suy c? quan. Tnh tr?ng ny x?y ra khi cc c? quan trong c? th? qu v? ng?ng ho?t ??ng. Vi rt gy COVID-19 d? ly lan. ?i?u ny c ngh?a l vi rt c th? ly lan t? ng??i sang ng??i qua cc gi?t b?n khi ho v h?t h?i (d?ch ti?t ???ng h h?p). Nguyn nhn g gy ra? B?nh ny do vi rt gy ra. Quy? vi? co? th? b? nhi?m vi rt do:  Ht ph?i cc gi?t b?n c?a ng??i b? nhi?m b?nh. Gi?t b?n c th? ly lan khi m?t ng??i ht th?, ni chuy?n, ht, ho ho?c h?t h?i.  Ch?m vo v?t g ?, nh? l bn ho?c tay n?m c?a, ? ti?p xc v?i vi rt (? b? nhi?m b?n) v sau ? ch?m vo mi?ng, m?i, ho?c m?t c?a qu v?. ?i?u g lm t?ng nguy c?? Nguy c? nhi?m trng Qu v? d? b? nhi?m vi rt ny h?n n?u qu v?:  Cch m?t ng??i b? COVID-19 trong vng 6 feet (2 mt).  Ch?m [place] ho?c s?ng chung v?i ng??i b? nhi?m COVID-19.  Dnh th?i gian trong khng gian trong nh ?ng ?c ho?c s?ng chung trong nh. Nguy c? b? b?nh  nghim tr?ng Qu v? d? b? b?nh nghim tr?ng do vi rt ny h?n n?u qu v?:  T? 50 tu?i tr? ln. Tu?i cng cao, qu v? cng c nguy c? b? b?nh nghim tr?ng.  S?ng trong nh ?i?u d??ng ho?c c? s? ch?m [place] di h?n.  B? ung th?.  C b?nh ko di (m?n tnh), ch?ng h?n nh?: ? B?nh ph?i m?n tnh, bao g?m b?nh ph?i t?c ngh?n m?n tnh ho?c hen suy?n. ? M?t b?nh ko di lm gi?m kh? n?ng ch?ng l?i nhi?m trng c?a c? th? (b? suy gi?m mi?n d?ch). ? B?nh tim, bao g?m suy tim, m?t tnh tr?ng trong ? cc ??ng m?ch d?n ??n tim b? h?p ho?c b? t?c (b?nh ??ng m?ch vnh), m?t b?nh lm cho c? tim dy ln, y?u ho?c c?ng (b?nh c? tim). ? Ti?u ???ng. ? B?nh th?n m?n tnh. ? B?nh h?ng c?u hnh li?m l m?t tnh tr?ng b?nh l trong ? h?ng c?u c d?ng hnh "li?m" b?t th??ng. ? B?nh gan.  Bo ph. C  cc d?u hi?u ho?c tri?u ch?ng g? Cc tri?u ch?ng c?a tnh tr?ng ny c th? t? nh? ??n n?ng. Tri?u ch?ng c th? xu?t hi?n b?t k? lc no t? 2 ??n 14 ngy sau khi qu v? ph?i nhi?m v?i vi rt. Cc tri?u ch?ng ? bao g?m:  S?t ho?c ?n l?nh.  Ho.  Kh th?.  ?au ??u, ?au nh?c c? th?, ho?c ?au nh?c c?.  Ch?y n??c m?i ho?c ng?t (ngh?t) m?i.  ?au h?ng.  M?t v? gic ho?c kh?u gic m?i x?y ra. M?t s? ng??i c?ng c th? b? cc v?n ?? ? b?ng, ch?ng h?n nh? bu?n nn, nn i ho?c tiu ch?y. Nh?ng ng??i khc c th? khng c b?t k? tri?u ch?ng no c?a COVID-19. Ch?n ?on tnh tr?ng ny nh? th? no? Tnh tr?ng ny c th? ???c ch?n ?on d?a vo:  Cc d?u hi?u v tri?u ch?ng c?a qu v?, ??c bi?t l n?u: ? Qu v? s?ng trong khu v?c c ??t bng pht d?ch COVID-19. ? G?n ?y qu v? ? ?i ??n ho?c ?i t? khu v?c hay c vi rt ny. ? Qu v? ch?m [place] ho?c s?ng chung v?i ng??i ? c ch?n ?on l b? COVID-19. ? Qu v? ? ti?p xc v?i m?t ng??i c ch?n ?on l b? COVID-19.  Khm th?c th?.  Cc xt nghi?m, c th? bao g?m: ? L?y m?t m?u d?ch ? thnh sau m?i v h?ng (d?ch m?i h?ng), m?i ho?c h?ng c?a qu v? b?ng cch dng t?m  bng. ? M?t m?u d?ch nh?y ? ph?i (??m) c?a qu v?. ? Xt nghi?m mu.  Cc ki?m tra hnh ?nh, c th? bao g?m ch?p X quang, ch?p CT (c?t l?p vi tnh), ho?c siu m. Tnh tr?ng ny ???c ?i?u tr? nh? th? no? Hi?n t?i, khng c thu?c ?i?u tr? COVID-19. Cc thu?c ?i?u tr? cc b?nh khc ?ang ???c s? d?ng trn c? s? th? nghi?m ?? xem c hi?u qu? ch?ng l?i COVID-19 hay khng. Chuyn gia ch?m [place] s?c kh?e s? trao ??i v?i qu v? v? cch ?i?u tr? cc tri?u ch?ng c?a qu v?. ??i v?i h?u h?t m?i ng??i, nhi?m trng ? m?c nh? v c th? ???c x? tr t?i nh b?ng cch ngh? ng?i, dng ?? l?ng v thu?c khng k ??n. ?i?u tr? nhi?m trng nghim tr?ng th??ng di?n ra ? khoa ?i?u tr? tch c?c (ICU) c?a b?nh vi?n. C th? bao g?m m?t ho?c nhi?u bi?n pha?p ?i?u tr? sau ?y. Nh?ng bi?n php ?i?u tr? ny ???c p d?ng cho ??n khi cc tri?u ch?ng c?a qu v? c?i thi?n.  Dng cc lo?i d?ch v thu?c qua ???ng t?nh m?ch.  B? sung thm  xi.  xi b? sung ???c cho dng qua m?t ci ?ng ? m?i, m?t n?, ho?c m? trm ??u.  Qu v? ? t? th? n?m s?p (t? th? p s?p). T? th? ny lm cho  xi d? vo ph?i h?n.  My c p su?t ???ng th? d??ng lin t?c (CPAP) ho?c p su?t ???ng th? d??ng hai m?c (BPAP). Bi?n php ?i?u tr? ny s? d?ng p su?t kh nh? ?? gi? cho ???ng th? khai thng. M?t ?ng ???c k?t n?i v?i m?t ??ng c? phn ph?i  xi ??n c? th?.  My th?. Bi?n php ?i?u tr? ny ??a khng kh vo v ra kh?i ph?i b?ng cch s? d?ng m?t ?ng ??t vo kh qu?n c?a qu v?.  M? thng kh qu?n. ?y l m?t th? thu?t t?o m?t  l? ? c? ?? c th? ??a ?ng th? vo.  Trao ??i  xi qua mng ngoi c? th? (ECMO). Th? thu?t ny cho ph?i c? h?i h?i ph?c b?ng cch ??m nhi?m cc ch?c n?ng c?a tim v ph?i. N cung c?p  xi cho c? th? v lo?i b? carbon dioxide. Tun th? nh?ng h??ng d?n ny ? nh: L?i s?ng  N?u qu v? b? ?m, hy ? nh tr? khi c?n ?i khm. Chuyn gia ch?m so?c s??c kho?e se? cho bi?t qu v? c?n ? nh bao lu. G?i cho chuyn gia ch?m [place] s?c kh?e tr??c  khi qu v? ?i khm.  Ngh? ng?i ? nh theo ch? d?n c?a chuyn gia ch?m [place] s?c kh?e.  Khng s? d?ng b?t k? s?n ph?m no c nicotine ho?c thu?c l, ch?ng ha?n nh? thu?c l d?ng ht, thu?c l ?i?n t? v thu?c l d?ng nhai. N?u qu v? c?n gip ?? ?? cai thu?c, hy h?i chuyn gia ch?m [place] s?c kh?e.  Tr? l?i sinh ho?t bnh th??ng theo ch? d?n c?a chuyn gia ch?m [place] s?c kh?e. Hy h?i chuyn gia ch?m [place] s?c kh?e v? cc ho?t ??ng no l an ton cho qu v?. H??ng d?n chung  Ch? s? d?ng thu?c khng k ??n v thu?c k ??n theo ch? d?n c?a chuyn gia ch?m [place] s?c kh?e.  U?ng ?? n??c ?? gi? cho n??c ti?u c mu vng nh?t.  Tun th? t?t c? cc l?n khm theo di theo ch? d?n c?a chuyn gia ch?m [place] s?c kh?e. ?i?u ny c vai tr quan tr?ng. Ng?n ng?a tnh tr?ng ny nh? th? no?  Khng c v?c xin no gip ng?n ng?a nhi?m COVID-19. Tuy nhin, c cc b??c qu v? c th? th?c hi?n ?? b?o v? b?n thn v nh?ng ng??i khc kh?i vi rt ny. ?? b?o v? b?n thn:   Khng ?i ??n cc khu v?c c nguy c? v? COVID-19. Cc khu v?c n?i c bo co v? COVID-19 th??ng xuyn thay ??i. ?? xc ??nh cc khu v?c c nguy c? cao v h?n ch? ?i l?i, hy ki?m tra [employer] l?ch c?a CDC: [email or web address]  N?u qu v? s?ng ?, ho?c ph?i ?i ??n, khu v?c m COVID-19 l m?t nguy c?, hy th?c hi?n cc bi?n php phng ng?a ?? trnh nhi?m b?nh. ? [place] xa nh?ng ng??i b? ?m. ? R?a tay th??ng xuyn trong 20 giy b?ng x phng v n??c. N?u khng c s?n x phng v n??c, hy s? d?ng thu?c st khu?n tay c c?n. ? Trnh ch?m tay ln mi?ng, m?t, m?t ho?c m?i. ? Trnh ?i ??n n?i cng c?ng, tun theo h??ng d?n c?a cc c? [place] y t? c th?m quy?n c?a ti?u bang v ??a ph??ng. ? N?u qu v? ph?i ?i ra ngoi, hy ?eo kh?u trang v?i ho?c m?t n?. ??m b?o r?ng kh?u trang che m?i v mi?ng qu v?. ? Trnh cc khng gian trong nh ?ng ?c. ? cch xa nh?ng ng??i khc t nh?t 6 feet (2 mt). ? Kh? trng cc ?? v?t v b? m?t th??ng xuyn ch?m vo m?i  ngy. Cc ?? v?t v b? m?t ny c th? bao g?m:  Cc qu?y v bn.  Tay n?m c?a v cng t?c ?n.  B?n r?a v vi n??c.  Cc thi?t b? ?i?n t?, nh? ?i?n tho?i, ?i?u khi?n t? xa, bn phm, my tnh v my tnh b?ng. ?? b?o v? ng??i khc: N?u qu v? c cc tri?u  ch?ng c?a COVID-19, hy th?c hi?n cc b??c ?? trnh vi rt ly lan sang ng??i khc.  N?u qu v? ngh? r?ng qu v? b? nhi?m COVID-19, hy lin h? ngay v?i chuyn gia ch?m [place] s?c kh?e. Hy cho nhm ch?m [place] s?c kh?e bi?t n?u qu v? ngh? r?ng qu v? c th? b? nhi?m COVID-19.  ? nh. Ch? ra kh?i nh ?? ?i khm. Khng s? d?ng ph??ng ti?n giao thng cng c?ng.  Khng ?i du l?ch trong [employer] v? b? ?m.  R?a tay th??ng xuyn trong 20 giy b?ng x phng v n??c. N?u khng c s?n x phng v n??c, hy s? d?ng thu?c st trng tay c c?n.  [place] xa cc thnh vin khc trong gia ?nh c?a qu v?. Hy nh? cc thnh vin kh?e m?nh trong gia ?nh ch?m [place] con ci v v?t nui, n?u c th?. N?u qu v? ph?i ch?m [place] con ci ho?c v?t nui, hy r?a tay th??ng xuyn v ?eo kh?u trang. N?u c th?, hy ? trong phng c?a ring qu v?, ring bi?t v?i nh?ng ng??i khc. S? d?ng phng t?m khc.  Ha?y ba?o ?a?m cho t?t c? mo?i ng???i trong nha? quy? vi? r??a tay ky? va? th???ng xuyn.  Ho ho?c h?t h?i vo m?t ci kh?n gi?y ho?c tay o ho?c khu?u tay c?a qu v?. Khng ho ho?c h?t h?i vo bn tay ho?c vo khng kh.  ?eo kh?u trang v?i ho?c m?t n?. ??m b?o r?ng kh?u trang che m?i v mi?ng qu v?. N?i tm ki?m thm thng tin  Centers for Disease Control and Prevention (Trung tm Ki?m sot v Phng ng?a D?ch b?nh): [email or web address]  World Health Organization (T? ch?c Y t? Th? gi?i): [email or web address] Hy lin l?c v?i chuyn gia ch?m [place] s?c kh?e n?u:  Qu v? s?ng ho?c ? ?i ??n khu v?c c nguy c? v? COVID-19 v qu v? c cc tri?u ch?ng nhi?m b?nh.  Qu v? ? ti?p xc v?i ai ? nhi?m COVID-19 v qu v? c cc tri?u  ch?ng nhi?m b?nh. Yu c?u tr? gip ngay l?p t?c n?u:  Qu v? b? kh th?.  Qu v? b? ?au ho?c t?c ng?c.  Qu v? b? l l?n.  Qu v? b? xanhh ti ? mi v mng tay.  Qu v? kh th?c d?y khi ?ang ng?[place] v? c cc tri?u ch?ng tr?m tr?ng h?n. Nh?ng tri?u ch?ng ny c th? l bi?u hi?n c?a m?t v?n ?? nghim tr?ng c?n c?p c?u. Khng ch? xem tri?u ch?ng c h?t khng. Hy ?i khm ngay l?p t?c. G?i cho d?ch v? c?p c?u t?i ??a ph??ng (911 ? [place] Khng t? li xe ??n b?nh vi?n. Hy cho nhn vin y t? c?p c?u bi?t n?u qu v? ngh? r?ng qu v? nhi?m COVID-19. Tm t?t  COVID-19 l m?t b?nh nhi?m trng ???ng h h?p do vi rt gy ra. B?nh ny cn ???c g?i l b?nh do coronavirus ho?c coronavirus ch?ng m?i. Vi rt ny c th? gy nhi?m trng nghim tr?ng, ch?ng h?n nh? vim ph?i, h?i ch?ng suy h h?p c?p, suy h h?p c?p, ho?c nhi?m trng huy?t.  Vi rt gy COVID-19 d? ly lan. ?i?u ny c ngh?a l vi rt ny ly t? ng??i sang ng??i qua nh?ng gi?t b?n do ht th?, ni chuy?n, ht, ho ho?c h?t h?i.  Qu v? c nhi?u kh? n?ng b? b?nh nghim tr?ng n?u qu v? t? 50 tu?i tr? ln, c h? mi?n d?ch y?u, s?ng  trong nh ?i?u d??ng, ho?c c b?nh m?n tnh.  Khng c thu?c no ?i?u tr? COVID-19. Chuyn gia ch?m [place] s?c kh?e s? trao ??i v?i qu v? v? cch ?i?u tr? cc tri?u ch?ng c?a qu v?.  Th?c hi?n cc b??c ?? b?o v? b?n thn v nh?ng ng??i khc kh?i b? nhi?m b?nh. R?a tay th??ng xuyn v kh? trng cc ?? v?t v cc b? m?t th??ng xuyn ch?m vo m?i ngy. Trnh xa nh?ng ng??i b? ?m v ?eo kh?u trang n?u qu v? b? ?m. Thng tin ny khng nh?m m?c ?ch thay th? cho l?i khuyn m chuyn gia ch?m [place] s?c kh?e ni v?i qu v?. Hy b?o ??m qu v? ph?i th?o lu?n b?t k? v?n ?? g m qu v? c v?i chuyn gia ch?m [place] s?c kh?e c?a qu v?. Document Revised: 03/15/2019 Document Reviewed: 03/15/2019 Elsevier Patient Education  2020 Elsevier Inc.  COVID-19: Cch b?o v? b?n thn qu v? v nh?ng ng??i khc COVID-19: How to Protect  Yourself and Others Bi?t vi rt ny ly lan nh? th? no  Hi?n khng c v?c xin no ng?n ng?a ???c b?nh do coronavirus 2019 (COVID-19) gy ra.  Cch t?t nh?t ?? ng?n ng?a b?nh l trnh ph?i nhi?m v?i vi rt ny.  Vi rt ???c cho l ly lan ch? y?u t? ng??i sang ng??i. ? Gi?a nh?ng ng??i ti?p xc g?n v?i nhau (trong kho?ng 6 feet = kho?ng 2 mt). ? Thng qua cc gi?t b?n ? ???ng h h?p t?o ra khi ng??i b? nhi?m b?nh ho, h?t h?i ho?c ni chuy?n. ? Nh?ng gi?t b?n ny c th? r?i vo mi?ng ho?c m?i c?a nh?ng ng??i g?n ? ho?c c th? b? ht vo ph?i. ? COVID-19 c th? ly lan t? nh?ng ng??i khng c tri?u ch?ng. M?i ng??i c?n ph?i V? sinh tay th??ng xuyn  R?a tay th??ng xuyn b?ng x phng v n??c trong t nh?t 20 giy, ??c bi?t l sau khi qu v? ? n?i cng c?ng v?, ho?c sau khi x m?i, ho, ho?c h?t h?i.  N?u khng c s?n x phng v n??c, hy s? d?ng dung d?ch st trng tay c t nh?t 60% c?n. Bi dung d?ch ln t?t c? cc b? m?t c?a bn tay v ch hai bn tay vo nhau cho ??n khi c?m th?y kh.  Trnh ch?m tay ch?a r?a ln m?t, m?i v mi?ng. Trnh ti?p xc g?n  H?n ch? ti?p xc v?i nh?ng ng??i khc cng nhi?u cng t?t.  Trnh ti?p xc g?n v?i ng??i ?m.  Gi? kho?ng cch gi?a b?n thn qu v? v ng??i khc. ? Hy nh? r?ng m?t s? ng??i khng c tri?u ch?ng c th? ly lan vi rt. ? ?i?u ny ??c bi?t quan tr?ng ??i v?i nh?ng ng??i c nguy c? cao b? b?nh [email or web address] che mi?ng v m?i qu v? b?ng kh?u trang khi c nh?ng ng??i khc xung [place] v? c th? ly lan COVID-19 cho nh?ng ng??i khc ngay c? khi qu v? khng c?m th?y b? b?nh.  M?i ng??i nn ?eo kh?u trang khi ? n?i cng c?ng v khi ? xung quanh nh?ng ng??i v gia c?, ??c bi?t l khi kh duy tr gin cch x h?i. ? Khng ???c ?eo kh?u trang cho tr? nh? d??i 2 tu?i, b?t k? ai b? kh th?, ho?c b?t t?nh, b? thi?u n?ng ho?c khng th? t? tho kh?u trang m khng  c?n tr? gip.  Kh?u trang l bi?n php b?o  v? ng??i khc trong tr??ng h?p qu v? b? nhi?m b?nh.  KHNG s? d?ng kh?u trang dnh cho nhn vin y t?.  Ti?p t?c gi? kho?ng cch kho?ng 6 feet = kho?ng 2 mt gi?a b?n thn qu v? v ng??i khc. Kh?u trang khng thay th? cho vi?c gin cch x h?i. Che mi?ng v m?i khi ho v h?t h?i  Lun che mi?ng v m?i b?ng kh?n gi?y khi qu v? ho ho?c h?t h?i ho?c s? d?ng m?t trong khu?u tay qu v?.  V?t b? kh?n gi?y ? s? d?ng vo thng rc.  Ngay l?p t?c r?a tay b?ng x phng v n??c trong t nh?t 20 giy. N?u khng c s?n x phng v n??c, hy v? sinh hai bn tay b?ng dung d?ch st trng tay c t nh?t 60% c?n. V? sinh v kh? trng  V? sinh V kh? trng cc b? m?t th??ng xuyn ch?m vo hng ngy. Cc b? m?t ny bao g?m bn, tay n?m c?a, cng t?c ?n, m?t bn, tay c?m, bn lm vi?c, ?i?n tho?i, bn phm, nh v? sinh, vi n??c v b?n r?a. [email or web address]  N?u cc b? m?t b?n, hy lm s?ch cc b? m?t ?: S? d?ng ch?t t?y r?a ho?c x phng v n??c tr??c khi kh? trng.  Sau ?, s? d?ng ch?t kh? trng gia d?ng. C th? xem danh sch cc ch?t kh? trng gia d?ng ? ??ng k EPA t?i ?y. [email or web address] 07/08/2018 Thng tin ny khng nh?m m?c ?ch thay th? cho l?i khuyn m chuyn gia ch?m [place] s?c kh?e ni v?i qu v?. Hy b?o ??m qu v? ph?i th?o lu?n b?t k? v?n ?? g m qu v? c v?i chuyn gia ch?m [place] s?c kh?e c?a qu v?. Document Revised: 02/12/2019 Document Reviewed: 12/07/2018 Elsevier Patient Education  2020 Elsevier Inc.   Cc cu h?i th??ng g?p v? COVID-19 COVID-19 Frequently Asked Questions COVID-19 (b?nh do coronavirus) l tnh tr?ng nhi?m trng do m?t h? vi rt l?n gy ra. M?t s? vi rt gy b?nh ? ng??i v nh?ng vi rt khc gy b?nh ? ??ng v?t nh? l?c ?, mo v d?i. Trong m?t s? tr??ng h?p, vi rt gy b?nh ? ??ng v?t c th? ly lan sang ng??i. Coronavirus b?t ngu?n t? ?u? Vo  thng M??i hai n?m 2019, Trung Qu?c ? thng bo v?i World Science writer (WHO) (T? ch?c Y t? Th? gi?i) v? nhi?u tr??ng h?p b? b?nh ph?i (b?nh h h?p ? ng??i). Nh?ng tr??ng h?p ny c lin quan ??n ch? gia [place] v h?i s?n ngoi tr?i ? thnh ph? V? Hn. Vi?c c lin quan ??n ch? gia [place] v h?i s?n cho th?y vi rt ny c th? ly lan t? ??ng v?t sang ng??i. Tuy nhin, v ??t bng pht d?ch ??u tin vo thng M??i hai, nn vi rt c?ng ???c cho l ly lan t? ng??i sang ng??i. Tn b?nh v tn c?a vi rt l g? Tn b?nh Ban ??u, b?nh ny ???c g?i l coronavirus ch?ng m?i. ?y l v cc nh khoa h?c xc ??nh r?ng b?nh ny do m?t lo?i vi rt h h?p m?i (ch?ng m?i) gy ra. World Science writer (WHO) (T? ch?c Y t? Th? gi?i) hi?n ? ??t tn b?nh ny l COVID-19, ho?c b?nh do coronavirus. Tn c?a vi rt Vi rt gy ra b?nh c tn l h?i ch?ng h h?p c?p tnh n?ng coronavirus 2 (SARS-Cov-2). Thng tin thm v? b?nh v ??t tn vi rt World Science writer (WHO) (  T? ch?c Y t? Th? gi?i): www.who.int/emergencies/diseases/novel-coronavirus-2019/technical-guidance/naming-the-coronavirus-disease-(covid-2019)-and-the-virus-that-causes-it Ai c nguy c? b? bi?n ch?ng c?a b?nh do coronavirus ny? M?t s? ng??i c th? c nguy c? b? bi?n ch?ng c?a b?nh do coronavirus ny cao h?n. H? bao g?m ng??i cao tu?i v nh?ng ng??i b? b?nh m?n tnh, ch?ng h?n nh? b?nh tim, ti?u ???ng v b?nh ph?i. N?u qu v? c nguy c? b? bi?n ch?ng cao h?n, hy th?c hi?n cc bi?n php phng ng?a b? sung sau:  ? nh cng nhi?u cng t?t.  Trnh cc bu?i t? t?p x giao v ?i l?i.  Trnh ti?p xc g?n v?i ng??i khc. ? cch xa nh?ng ng??i khc t nh?t 6 ft (2 m), n?u c th?.  R?a tay th??ng xuyn b?ng x phng v n??c trong t nh?t 20 giy.  Trnh ch?m tay ln m?t, mi?ng, m?i ho?c m?t.  Tch tr? cc ?? ? nh nh? th?c ph?m, thu?c v ?? ?? v? sinh.  N?u qu v? ph?i ?i ra ngoi, hy ?eo kh?u trang v?i ho?c m?t n?. ??m b?o r?ng kh?u trang che  m?i v mi?ng qu v?. B?nh do coronavirus ly lan nh? th? no? Vi rt gy b?nh coronavirus d? dng ly lan t? ng??i sang ng??i (d? ly). Quy? vi? co? th? b? nhi?m vi rt do:  Ht ph?i cc gi?t b?n c?a ng??i b? nhi?m b?nh. Gi?t b?n c th? ly lan khi m?t ng??i ht th?, ni chuy?n, ht, ho ho?c h?t h?i.  Ch?m vo v?t g ?, nh? l bn ho?c tay n?m c?a, ? ti?p xc v?i vi rt (? b? nhi?m b?n) v sau ? ch?m vo mi?ng, m?i, ho?c m?t c?a qu v?. Ti c th? nhi?m vi rt do ch?m vo cc b? m?t ho?c ?? v?t khng? V?n cn nhi?u ?i?u chng ta ch?a bi?t v? vi rt gy b?nh coronavirus. Cc nh khoa h?c ?ang ch? y?u d?a vo thng tin v? nh?ng g h? bi?t v? cc vi rt t??ng t?, nh?:  Vi rt th??ng khng th? s?ng st trn cc b? m?t trong th?i gian di. Cc vi rt ? c?n c? th? ng??i (v?t ch?) ?? s?ng st.  C nhi?u kh? n?ng vi rt ly lan qua ti?p xc g?n v?i nh?ng ng??i b? b?nh (ti?p xc tr?c ti?p), ch?ng h?n nh? qua: ? B?t tay ho?c m. ? Ht ph?i cc gi?t b?n t? ???ng h h?p di chuy?n trong [employer] kh. Gi?t b?n c th? ly lan khi m?t ng??i ht th?, ni chuy?n, ht, ho ho?c h?t h?i.  C t kh? n?ng vi rt ly lan khi m?t ng??i ch?m vo b? m?t ho?c ?? v?t c vi rt trn ? (ti?p xc gin ti?p). Vi rt c kh? n?ng xm nh?p vo c? th? n?u ng??i ny ch?m vo b? m?t ho?c ?? v?t v sau ? ch?m ln m?t, m?t, m?i ho?c mi?ng. M?t ng??i c th? ly lan vi rt m khng c tri?u ch?ng c?a b?nh khng? C kh? n?ng vi rt ly lan tr??c khi m?t ng??i c cc tri?u ch?ng c?a b?nh, nh?ng nhi?u kh? n?ng nh?t l ?y khng ph?i l cch ly lan chnh c?a vi rt. C nhi?u kh? n?ng vi rt ly lan do ti?p xc g?n v?i ng??i b? b?nh v ht ph?i nh?ng gi?t b?n t? ???ng h h?p ly lan khi m?t ng??i ht th?, ni chuy?n, ht, ho ho?c h?t h?i. B?nh do coronavirus c nh?ng tri?u ch?ng g? Cc tri?u ch?ng c?a m?i ng??i khc nhau v c  th? t? nh? ??n n?ng. Cc tri?u ch?ng c th? bao g?m:  S?t ho?c ?n l?nh.  Ho.  Kh th? ho?c c?m th?y th?  d?c.  ?au ??u, ?au nh?c c? th?, ho?c ?au nh?c c?.  Ch?y n??c m?i ho?c ng?t (ngh?t) m?i.  ?au h?ng.  M?t v? gic ho?c kh?u gic m?i x?y ra.  Bu?n nn, nn ho?c tiu ch?y. Nh?ng tri?u ch?ng ny c th? xu?t hi?n b?t k? ? ?u t? 2 ??n 14 ngy sau khi qu v? ph?i nhi?m v?i vi rt. M?t s? ng??i c th? khng c b?t c? tri?u ch?ng no. N?u qu v? c cc tri?u ch?ng, hy g?i cho chuyn gia ch?m [place] s?c kh?e. Nh?ng ng??i c tri?u ch?ng n?ng c th? c?n ch?m [place] t?i b?nh vi?n. Ti c c?n ph?i xt nghi?m ?? tm vi rt ny khng? Chuyn gia ch?m [place] s?c kh?e s? quy?t ??nh c xt nghi?m cho qu v? hay khng d?a trn cc tri?u ch?ng, ti?n s? ph?i nhi?m v cc y?u t? nguy c? c?a qu v?. Chuyn gia ch?m [place] s?c kh?e xt nghi?m ?? tm vi rt ny nh? th? no? Chuyn gia ch?m [place] s?c kh?e s? l?y m?u g?i ?i lm xt nghi?m. Cc m?u c th? bao g?m:  L?y m?t m?u t?m bng ngoy d?ch ? thnh sau m?i v h?ng, m?i ho?c h?ng c?a qu v?.  L?y d?ch ? ph?i b?ng cch cho qu v? ho ra d?ch nh?y (??m) vo c?c v trng.  L?y m?u mu. C ph??ng php ?i?u tr? ho?c v?c xin cho vi rt ny khng? Hi?n t?i, khng c v?c xin ?? ng?n ng?a b?nh do coronavirus. Ngoi ra, khng c lo?i thu?c no nh? thu?c khng sinh ho?c thu?c khng vi rt ?? ?i?u tr? vi rt ny. M?t ng??i b? b?nh ???c ch?m [place] h? tr?, c ngh?a l ngh? ng?i v dng cc lo?i d?ch. M?t ng??i c?ng c th? lm gi?m cc tri?u ch?ng c?a mnh b?ng cch s? d?ng thu?c khng k ??n ?? ?i?u tr? h?t h?i, ho v ch?y n??c m?i. ?y l nh?ng lo?i thu?c t??ng t? nh? lo?i thu?c m m?t ng??i dng ?? tr? c?m l?nh thng th??ng. N?u qu v? c cc tri?u ch?ng, hy g?i cho chuyn gia ch?m [place] s?c kh?e. Nh?ng ng??i c tri?u ch?ng n?ng c th? c?n ch?m [place] t?i b?nh vi?n. Ti c th? lm g ?? b?o v? b?n thn v gia ?nh ti kh?i vi rt ny?     Qu v? c th? t? b?o v? b?n thn v gia ?nh b?ng cch th?c hi?n cc hnh ??ng t??ng t? nh? hnh ??ng m qu v? s? lm ?? phng ng?a s? ly lan c?a cc vi  rt khc. Th?c hi?n cc hnh ??ng sau:  R?a tay th??ng xuyn b?ng x phng v n??c trong t nh?t 20 giy. N?u khng c s?n x phng v n??c, hy s? d?ng thu?c st trng tay c c?n.  Trnh ch?m tay ln m?t, mi?ng, m?i ho?c m?t.  Ho ho?c h?t h?i vo m?t ci kh?n gi?y ho?c tay o ho?c khu?u tay. Khng ho ho?c h?t h?i vo bn tay ho?c vo khng kh. ? N?u ho ho?c h?t h?i vo kh?n gi?y, hy v?t b? kh?n gi?y ? ngay l?p t?c v r?a tay.  Kh? trng cc ?? v?t v cc b? m?t qu v? th??ng xuyn ch?m vo m?i ngy.  Trnh xa nh?ng ng??i b? ?m.  Trnh ?i ??n n?i cng c?ng, tun  theo h??ng d?n c?a cc c? [place] t? c th?m quy?n c?a ti?u bang v ??a ph??ng.  Hessie Diener cc khng gian trong nh ?ng ?c. ? cch xa nh?ng ng??i khc t nh?t 6 ft (2 m).  N?u qu v? ph?i ?i ra ngoi, hy ?eo kh?u trang v?i ho?c m?t n?. ??m b?o r?ng kh?u trang che m?i v mi?ng qu v?.  N?u qu v? b? ?m, hy ? nh tr? khi c?n ?i khm. G?i cho chuyn gia ch?m [place] s?c kh?e tr??c khi qu v? ?i khm. Chuyn gia ch?m so?c s??c kho?e se? cho bi?t qu v? c?n ? nh bao lu.  Hy ch?c ch?n r?ng qu v? tim ??y ?? v?c xin. Hy h?i chuyn gia ch?m [place] s?c kh?e v? lo?i v?c xin qu v? c?n. Ti nn lm g n?u c?n ?i l?i? Tun th? cc khuy?n co v? vi?c ?i l?i c?a c? [place] t? c th?m quy?n t?i ??a ph??ng, CDC v WHO. Thng tin v t? v?n v? vi?c ?i [place] for Disease Control and Prevention (CDC) (Trung tm Ki?m sot v Phng ng?a D?ch b?nh): [email or web address]  World Health Organization (WHO) (T? ch?c Y t? Th? gi?i): [email or web address].ca Bi?t r cc nguy c? v hnh ??ng ?? b?o v? s?c kh?e c?a qu v?  Qu v? c nguy c? b? b?nh do coronavirus cao h?n n?u qu v? ?i ??n cc khu v?c c ??t bng pht ho?c n?u qu v? ti?p xc v?i du khch t? cc khu v?c c ??t bng pht d?ch.  R?a tay th??ng xuyn v th?c hnh v? sinh t?t ?? gi?m nguy c? nhi?m ho?c ly lan vi  rt. Ti nn lm g n?u b? ?m? Cc ch? d?n chung ?? ch?n ??ng ly lan b?nh  R?a tay th??ng xuyn b?ng x phng v n??c trong t nh?t 20 giy. N?u khng c s?n x phng v n??c, hy s? d?ng thu?c st trng tay c c?n.  Ho ho?c h?t h?i vo m?t ci kh?n gi?y ho?c tay o ho?c khu?u tay. Khng ho ho?c h?t h?i vo bn tay ho?c vo khng kh.  N?u ho ho?c h?t h?i vo kh?n gi?y, hy v?t b? kh?n gi?y ? ngay l?p t?c v r?a tay.  ? nh tr? khi qu v? ph?i ?i khm. G?i cho chuyn gia ch?m [place] s?c kh?e ho?c c? [place] t? c th?m quy?n t?i ??a ph??ng tr??c khi qu v? ?i khm.  Hessie Diener cc khu v?c cng c?ng. Khng dng ph??ng ti?n giao thng cng c?ng, n?u c th?.  N?u c th?, hy ?eo kh?u trang n?u qu v? ph?i ra kh?i nh ho?c n?u qu v? ti?p xc g?n v?i ng??i khng b? ?m. ??m b?o r?ng kh?u trang che m?i v mi?ng qu v?. Gi? nh c?a s?ch s?  Kh? trng cc ?? v?t v b? m?t th??ng xuyn ch?m vo m?i ngy. Cc ?? v?t v b? m?t ny c th? bao g?m: ? Cc qu?y v bn. ? Tay n?m c?a v cng t?c ?n. ? B?n r?a v vi n??c. ? Cc thi?t b? ?i?n t? nh? ?i?n tho?i, ?i?u khi?n t? xa, bn phm, my tnh v my tnh b?ng.  R?a bt ??a b?ng n??c nng, n??c x phng ho?c s? d?ng my r?a bt. [place] kh bt ??a.  Gi?t ?? b?ng n??c nng. Phng ng?a ly nhi?m cho cc thnh vin trong gia ?nh  Hy nh? cc thnh vin kh?e m?nh trong gia ?nh ch?m [place] con ci v  v?t nui, n?u c th?. N?u qu v? ph?i ch?m [place] con ci ho?c v?t nui, hy r?a tay th??ng xuyn v ?eo kh?u trang.  Ng? trong phng ng? khc ho?c trn gi??ng khc, n?u c th?Imagene Sheller dng chung ?? dng c nhn, ch?ng h?n nh? dao c?o ru, bn ch?i ?nh r?ng, l?n kh? mi, bn ch?i, kh?n t?m v kh?n r?a m?t. N?i tm ki?m thm thng tin Centers for Disease Control and Prevention (Trung tm Ki?m sot v Phng ng?a D?ch b?nh)  Thng tin v tin t?c c?p nh?t: [email or web address].cz World Health Organization (WHO) (T? ch?c Y t? Th? gi?i)  Thng tin v tin  t?c c?p nh?t: AffordableSalon.es  Ch? ?? s?c kh?e v? coronavirus: [email or web address]  Cc cu h?i v cu tr? l?i v? COVID-19: [email or web address].com  H? th?ng theo di ton c?u: who.sprinklr.com [employer] (AAP) (Vi?n Hn lm [place] K?)  Thng tin v? gia ?nh: www.healthychildren.org/English/health-issues/conditions/chest-lungs/Pages/2019-Novel-Coronavirus.aspx Tnh hnh coronavirus ?ang thay ??i nhanh chng. Truy c?p trang web c?a c? quan y t? c th?m quy?n t?i ??a ph??ng ho?c cc trang web c?a CDC v WHO ?? bi?t thng tin c?p nh?t v tin t?c. Khi no ti c?n ph?i lin h? v?i chuyn gia ch?m [place] [place] s?c kh?e?  Lin h? v?i chuyn gia ch?m [place] s?c kh?e n?u qu v? c cc tri?u ch?ng nhi?m b?nh, ch?ng h?n nh? s?t ho?c ho v qu v?: ? ? ? g?n b?t c? ai ? bi?t l m?c b?nh do coronavirus. ? ? ti?p xc v?i m?t ng??i b? nghi ng? l m?c b?nh do coronavirus. ? ? ?i ??n m?t khu v?c c ??t bng pht d?ch COVID-19. Khi no ti c?n ?i khm c?p c?u?  Nh?n tr? gip ngay l?p t?c b?ng cch g?i ??n d?ch v? c?p c?u t?i ??a ph??ng (911 t?i [place]?) n?u qu v?: ? Kh th?. ? ?au ho?c t?c ng?c. ? L l?n. ? Mi v mng tay xanh ti. ? Kh th?c d?y khi ?ang ng?. ? Cc tri?u ch?ng tr?m tr?ng h?n. Hy cho nhn vin y t? c?p c?u bi?t n?u qu v? ngh? r?ng qu v? b? b?nh do coronavirus. Tm t?t  Vi rt h h?p ch?ng m?i ly lan t? ng??i sang ng??i v gy ra COVID-19 (b?nh do coronavirus).  Vi rt gy ra COVID-19 d??ng nh? d? dng ly lan. Vi rt ny ly t? ng??i sang ng??i qua nh?ng gi?t b?n do ht th?, ni chuy?n, ht, ho ho?c h?t h?i.  Ng??i cao tu?i v nh?ng ng??i b? b?nh m?n tnh c nguy c? b? b?nh cao h?n. N?u qu v? c nguy c? b? bi?n ch?ng cao h?n, hy th?c hi?n cc bi?n php phng ng?a b? sung.  Hi?n khng c v?c xin ?? ng?n ng?a b?nh do coronavirus. Khng c thu?c, ch?ng h?n nh? thu?c khng sinh  ho?c thu?c khng vi rt ?? ?i?u tr? vi rt ny.  Qu v? c th? t? b?o v? b?n thn v gia ?nh b?ng cch r?a tay th??ng xuyn, trnh ch?m vo m?t v che mi?ng v m?i khi ho v h?t h?i. Thng tin ny khng nh?m m?c ?ch thay th? cho l?i khuyn m chuyn gia ch?m [place] s?c kh?e ni v?i qu v?. Hy b?o ??m qu v? ph?i th?o lu?n b?t k? v?n ?? g m qu v? c v?i chuyn gia ch?m [place] s?c kh?e c?a qu v?. Document Revised: 03/15/2019 Document Reviewed: 03/15/2019 Elsevier Patient Education  2020 [employer].

## 2019-06-13 NOTE — TOC Transition Note (Signed)
Transition of Care [facility]) - CM/SW Discharge Note   Patient Details  Name: Lailana Shira MRN: [id number] Date of Birth: 1960/07/31  Transition of [facility]) CM/SW Contact:  Elliot Gault, LCSW Phone Number: 06/13/2019, 3:21 PM   Clinical Narrative:     Pt stable for dc per MD. Pt needs Home O2. Arranged with [employer] earlier today. Awaiting delivery of the home Concentrator before pt discharges. Updated RN.  There are no other TOC needs for dc.  Final next level of care: Home/Self Care Barriers to Discharge: Barriers Resolved   Patient Goals and CMS Choice        Discharge Placement                       Discharge Plan and Services                DME Arranged: Oxygen DME Agency: [employer]                  Social Determinants of Health (SDOH) Interventions     Readmission Risk Interventions No flowsheet data found.

## 2019-06-13 NOTE — Evaluation (Signed)
Physical Therapy Evaluation Patient Details Name: Alexis Hickman MRN: [id number] DOB: Dec 30, 1960 Today's Date: 06/13/2019   History of Present Illness  59 year old female who speaks [place] only with no significant past medical history who presented to the ED with COVID Pneumonia and acute hypoxic respiratory failure.  Clinical Impression  Pt moving well. Needs O2 at rest and with activity. Reiterated through interpreter importance of using O2 at home and quarantining. No further PT needed.    Follow Up Recommendations No PT follow up;Supervision - Intermittent    Equipment Recommendations  Other (comment)(O2)    Recommendations for Other Services       Precautions / Restrictions Precautions Precaution Comments: watch O2 Restrictions Weight Bearing Restrictions: No      Mobility  Bed Mobility               General bed mobility comments: OOB in recliner at beginning and end of session  Transfers Overall transfer level: Needs assistance Equipment used: None Transfers: Sit to/from Stand Sit to Stand: Supervision         General transfer comment: only to manage lines and translator  Ambulation/Gait Ambulation/Gait assistance: Supervision;+2 safety/equipment Gait Distance (Feet): 200 Feet Assistive device: None Gait Pattern/deviations: WFL(Within Functional Limits) Gait velocity: decr Gait velocity interpretation: 1.31 - 2.62 ft/sec, indicative of limited community ambulator General Gait Details: Steady gait. Incr O2 to 4L with activity.   Stairs            Wheelchair Mobility    Modified Rankin (Stroke Patients Only)       Balance Overall balance assessment: No apparent balance deficits (not formally assessed)                                           Pertinent Vitals/Pain Pain Assessment: No/denies pain    Home Living Family/patient expects to be discharged to:: Private residence Living Arrangements: Children Available Help  at Discharge: Family Type of Home: Apartment Home Access: Stairs to enter   Secretary/administrator of Steps: 2 Home Layout: One level Home Equipment: None      Prior Function Level of Independence: Independent         Comments: works at [employer]   Dominant Hand: Right    Extremity/Trunk Assessment   Upper Extremity Assessment Upper Extremity Assessment: Defer to OT evaluation    Lower Extremity Assessment Lower Extremity Assessment: Overall WFL for tasks assessed    Cervical / Trunk Assessment Cervical / Trunk Assessment: Normal  Communication   Communication: Prefers language other than English;Interpreter utilized(#[id number] [place])  Cognition Arousal/Alertness: Awake/alert Behavior During Therapy: WFL for tasks assessed/performed Overall Cognitive Status: Within Functional Limits for tasks assessed                                 General Comments: suspect low health literacy      General Comments General comments (skin integrity, edema, etc.): Used video translator [phone] [place]. Therapy team and translator took significant time to explain importance of supplemental oxygen and energy conservation. Pt also educated on quarantine at home to prevent family members from getting COVID    Exercises     Assessment/Plan    PT Assessment Patent does not need any further PT services  PT Problem List  PT Treatment Interventions      PT Goals (Current goals can be found in the Care Plan section)  Acute Rehab PT Goals Patient Stated Goal: to go home PT Goal Formulation: All assessment and education complete, DC therapy    Frequency     Barriers to discharge        Co-evaluation   Reason for Co-Treatment: Complexity of the patient's impairments (multi-system involvement);Other (comment)(equipment management and translator) PT goals addressed during session: Mobility/safety with mobility OT goals addressed during  session: ADL's and self-care       AM-PAC PT "6 Clicks" Mobility  Outcome Measure Help needed turning from your back to your side while in a flat bed without using bedrails?: None Help needed moving from lying on your back to sitting on the side of a flat bed without using bedrails?: None Help needed moving to and from a bed to a chair (including a wheelchair)?: None Help needed standing up from a chair using your arms (e.g., wheelchair or bedside chair)?: None Help needed to walk in hospital room?: None Help needed climbing 3-5 steps with a railing? : None 6 Click Score: 24    End of Session Equipment Utilized During Treatment: Oxygen Activity Tolerance: Patient tolerated treatment well Patient left: in chair;with call bell/phone within reach Nurse Communication: Mobility status PT Visit Diagnosis: Other abnormalities of gait and mobility (R26.89)    Time: 1122-1200 PT Time Calculation (min) (ACUTE ONLY): 38 min   Charges:   PT Evaluation $PT Eval Low Complexity: [place] Pager [phone] Office [place] 06/13/2019, 2:10 PM

## 2019-06-13 NOTE — Discharge Summary (Signed)
DISCHARGE SUMMARY  Alexis Hickman  MR#: [id number]  DOB:1960-12-10  Date of Admission: 06/07/2019 Date of Discharge: 06/13/2019  Attending Physician:Meer Reindl Silvestre Gunner, MD  Patient's VQQ:[id number], No Pcp Per  Consults: none   Disposition: D/C home at request of patient   Date of Positive COVID Test: 05/28/2019  Date Quarantine Ends: 06/18/2019  COVID-19 specific Treatment: Decadron 1/8 > 1/17 Remdesivir 1/8 > 1/12 Actemra 1/12  Follow-up Appts: Follow-up Information    Your Primary Care Provider Follow up.   Why: See your Primary Care Provider (the Social Worker will help you set one up if you don't have one) for a visit in 7-10 days.           Tests Needing Follow-up: -check O2 sats to determine if supplemental O2 support is still required   Discharge Diagnoses: COVID Pneumonia Acute hypoxic respiratory failure  Initial presentation: 59yo female who speaks [place] only with no significant past medical history who presented to the ED with worsening Covid symptoms after having a positive Covid test 12/29.  She complained primarily of progressive shortness of breath.  At presentation she required 15 L nonrebreather mask to stabilize her oxygenation.  Hospital Course:  COVID Pneumonia -acute hypoxic respiratory failure Completed a tx course of remdesivir - to complete decadron course w/ oral dosing post d/c - transitioned to conventional nasal cannula oxygen prior to d/c - PT/OT evaluated pt and she was able to keep sats at 88% or > on 4L [place], even w/ ambulation - pt strongly desired d/c home - home O2 arranged by TOC - f/u visit to be arranged by [facility] team   Allergies as of 06/13/2019      Reactions   Advil [ibuprofen]    Makes itchy when taking      Medication List    TAKE these medications   acetaminophen 325 MG tablet Commonly known as: TYLENOL Take 650 mg by mouth every 6 (six) hours as needed for moderate pain or fever.   dexamethasone 6 MG tablet Commonly  known as: DECADRON Take 1 tablet (6 mg total) by mouth daily for 3 days. Start taking on: June 14, 2019            Durable Medical Equipment  (From admission, onward)         Start     Ordered   06/13/19 1201  For home use only DME oxygen  Once    Question Answer Comment  Length of Need 6 Months   Mode or (Route) Nasal cannula   Liters per Minute 4   Frequency Continuous (stationary and portable oxygen unit needed)   Oxygen conserving device Yes   Oxygen delivery system Gas      06/13/19 1200          Day of Discharge BP 114/83 (BP Location: Right Arm)   Pulse 82   Temp 98.1 F (36.7 C) (Oral)   Resp 19   Ht 5' (1.524 m) Comment: estimated  Wt 57.9 kg   SpO2 95%   BMI 24.94 kg/m   Physical Exam: General: No acute respiratory distress Lungs: Clear to auscultation bilaterally without wheezes or crackles Cardiovascular: Regular rate and rhythm without murmur gallop or rub normal S1 and S2 Abdomen: Nontender, nondistended, soft, bowel sounds positive, no rebound, no ascites, no appreciable mass Extremities: No significant cyanosis, clubbing, or edema bilateral lower extremities  Basic Metabolic Panel: Recent Labs  Lab 06/08/19 0627 06/09/19 0232 06/10/19 0424 06/11/19 0432 06/12/19 0516  NA 140  140 141 141 139  K 4.7 4.3 4.1 3.7 3.9  CL 105 106 105 102 106  CO2 26 25 24 26 25   GLUCOSE 130* 116* 98 107* 104*  BUN 30* 32* 26* 24* 23  CREATININE 0.68 0.59 0.62 0.59 0.60  CALCIUM 8.0* 8.1* 8.2* 8.4* 8.0*  MG 2.7* 2.3 2.1 2.1 2.4  PHOS 2.6 3.0 3.0 2.8 2.9    Liver Function Tests: Recent Labs  Lab 06/08/19 0627 06/09/19 0232 06/10/19 0424 06/11/19 0432 06/12/19 0516  AST 39 31 24 28 20   ALT 31 30 24 25 22   ALKPHOS 49 50 49 51 49  BILITOT 0.4 0.5 0.6 0.7 0.5  PROT 6.4* 6.3* 6.3* 6.6 6.3*  ALBUMIN 2.7* 2.7* 2.6* 2.8* 2.6*    CBC: Recent Labs  Lab 06/08/19 0627 06/09/19 0232 06/10/19 0424 06/11/19 0432 06/12/19 0516  WBC 4.6 8.6 10.6*  9.6 5.7  NEUTROABS 3.1 6.9 8.9* 7.8* 3.9  HGB 12.5 12.5 12.4 13.2 13.2  HCT 38.6 38.6 38.3 40.8 39.9  MCV 91.9 92.1 92.5 92.1 91.1  PLT 214 252 269 301 320    Recent Results (from the past 240 hour(s))  Blood Culture (routine x 2)     Status: None   Collection Time: 06/07/19 10:08 AM   Specimen: BLOOD  Result Value Ref Range Status   Specimen Description   Final    BLOOD LEFT ANTECUBITAL Performed at [facility], [place] [address] [place], [place] [address]    Special Requests   Final    BOTTLES DRAWN AEROBIC AND ANAEROBIC Blood Culture adequate volume Performed at [facility] [address] [place], [place] [address]    Culture   Final    NO GROWTH 5 DAYS Performed at [facility], [place] [address] [place], [place] [address]    Report Status 06/12/2019 FINAL  Final  Blood Culture (routine x 2)     Status: None   Collection Time: 06/07/19 10:53 AM   Specimen: BLOOD  Result Value Ref Range Status   Specimen Description   Final    BLOOD RIGHT ANTECUBITAL Performed at [facility] [address], [place], [place] [address]    Special Requests   Final    BOTTLES DRAWN AEROBIC AND ANAEROBIC Blood Culture adequate volume Performed at [facility] [address] [place], [place] [address]    Culture   Final    NO GROWTH 5 DAYS Performed at [facility], [place] [address] [place], [place] [address]    Report Status 06/12/2019 FINAL  Final     Time spent in discharge (includes decision making & examination of pt): 35 minutes  06/13/2019, 5:36 PM   Cherene Altes, MD [facility] Office  [phone]

## 2019-07-01 ENCOUNTER — Other Ambulatory Visit: Payer: Self-pay

## 2019-07-01 ENCOUNTER — Telehealth (INDEPENDENT_AMBULATORY_CARE_PROVIDER_SITE_OTHER): Payer: BC Managed Care – PPO | Admitting: Registered Nurse

## 2019-07-01 DIAGNOSIS — U071 COVID-19: Secondary | ICD-10-CM | POA: Diagnosis not present

## 2019-07-01 MED ORDER — GUAIFENESIN-DM 100-10 MG/5ML PO SYRP: 5.0000 mL | ORAL_SOLUTION | ORAL | 0 refills | Status: AC | PRN

## 2019-07-01 NOTE — Patient Instructions (Signed)
° ° ° °  If you have lab work done today you will be contacted with your lab results within the next 2 weeks.  If you have not heard from us then please contact us. The fastest way to get your results is to register for My Chart. ° ° °IF you received an x-ray today, you will receive an invoice from [facility]. Please contact [facility] at [phone] with questions or concerns regarding your invoice.  ° °IF you received labwork today, you will receive an invoice from [facility]. Please contact [facility] at [phone] with questions or concerns regarding your invoice.  ° °Our billing staff will not be able to assist you with questions regarding bills from these companies. ° °You will be contacted with the lab results as soon as they are available. The fastest way to get your results is to activate your My Chart account. Instructions are located on the last page of this paperwork. If you have not heard from us regarding the results in 2 weeks, please contact this office. °  ° ° ° °

## 2019-07-01 NOTE — Progress Notes (Signed)
Patient is calling for follow up appointment about covid , and patient states she is doing better than before but not all the way better. Per patient she can now walk but it is very slow and feels like her head is weighing her down , and a dry cough that makes it hard for her to breathe. No fever , headache , or chills.

## 2019-07-01 NOTE — Progress Notes (Signed)
Telemedicine Encounter- SOAP NOTE Established Patient  This telephone encounter was conducted with the patient's (or proxy's) verbal consent via audio telecommunications: yes  Patient was instructed to have this encounter in a suitably private space; and to only have persons present to whom they give permission to participate. In addition, patient identity was confirmed by use of name plus two identifiers (DOB and address).  I discussed the limitations, risks, security and privacy concerns of performing an evaluation and management service by telephone and the availability of in person appointments. I also discussed with the patient that there may be a patient responsible charge related to this service. The patient expressed understanding and agreed to proceed.  I spent a total of 15 minutes talking with the patient or their proxy.  No chief complaint on file.   Subjective   Ala Kratz is a 59 y.o. established patient. Telephone visit today for COVID follow up  HPI Pt admitted via ER on 06/07/19 with acute respiratory failure with hypoxia d/t COVID and COVID PNA.  Was stabilized, discharged on 06/13/19 stable and on RA. Reports that while she's improving clinically, she has had a lingering cough and feels as though there's congestion that she is having trouble getting up. Notes that her head has felt heavy, no sinus pressure or congestion Some lingering fatigue that is also improving.   Patient Active Problem List   Diagnosis Date Noted  . Acute hypoxemic respiratory failure due to COVID-19 ([place]) 06/07/2019  . Left flank pain 06/20/2016    Past Medical History:  Diagnosis Date  . Allergy     Current Outpatient Medications  Medication Sig Dispense Refill  . acetaminophen (TYLENOL) 325 MG tablet Take 650 mg by mouth every 6 (six) hours as needed for moderate pain or fever.     No current facility-administered medications for this visit.    Allergies  Allergen Reactions  . Advil  [Ibuprofen]     Makes itchy when taking    Social History   Socioeconomic History  . Marital status: Married    Spouse name: Not on file  . Number of children: Not on file  . Years of education: Not on file  . Highest education level: Not on file  Occupational History  . Not on file  Tobacco Use  . Smoking status: Never Smoker  . Smokeless tobacco: Never Used  Substance and Sexual Activity  . Alcohol use: No    Alcohol/week: 0.0 standard drinks  . Drug use: No  . Sexual activity: Not on file  Other Topics Concern  . Not on file  Social History Narrative  . Not on file   Social Determinants of Health   Financial Resource Strain:   . Difficulty of Paying Living Expenses: Not on file  Food Insecurity:   . Worried About Charity fundraiser in the Last Year: Not on file  . Ran Out of Food in the Last Year: Not on file  Transportation Needs:   . Lack of Transportation (Medical): Not on file  . Lack of Transportation (Non-Medical): Not on file  Physical Activity:   . Days of Exercise per Week: Not on file  . Minutes of Exercise per Session: Not on file  Stress:   . Feeling of Stress : Not on file  Social Connections:   . Frequency of Communication with Friends and Family: Not on file  . Frequency of Social Gatherings with Friends and Family: Not on file  . Attends Religious  Services: Not on file  . Active Member of Clubs or Organizations: Not on file  . Attends Banker Meetings: Not on file  . Marital Status: Not on file  Intimate Partner Violence:   . Fear of Current or Ex-Partner: Not on file  . Emotionally Abused: Not on file  . Physically Abused: Not on file  . Sexually Abused: Not on file    Review of Systems  Constitutional: Negative.   HENT: Negative.   Eyes: Negative.   Respiratory: Positive for cough and sputum production. Negative for hemoptysis, shortness of breath and wheezing.   Cardiovascular: Negative.   Gastrointestinal: Negative.     Genitourinary: Negative.   Musculoskeletal: Negative.   Skin: Negative.   Neurological: Negative.   Endo/Heme/Allergies: Negative.   Psychiatric/Behavioral: Negative.   All other systems reviewed and are negative.   Objective   Vitals as reported by the patient: There were no vitals filed for this visit.  Diagnoses and all orders for this visit:  COVID-19 -     guaiFENesin-dextromethorphan (ROBITUSSIN DM) 100-10 MG/5ML syrup; Take 5 mLs by mouth every 4 (four) hours as needed for cough.   PLAN  Robitussin DM for cough suppression, decongestant, expectorant.   Suggest rest  Continue acetaminophen as needed for aches, headaches, etc  Fluids and regular meals  Given rtc precautions  Patient encouraged to call clinic with any questions, comments, or concerns.   I discussed the assessment and treatment plan with the patient. The patient was provided an opportunity to ask questions and all were answered. The patient agreed with the plan and demonstrated an understanding of the instructions.   The patient was advised to call back or seek an in-person evaluation if the symptoms worsen or if the condition fails to improve as anticipated.  I provided 15 minutes of non-face-to-face time during this encounter.  Janeece Agee, NP  Primary Care at [facility]

## 2019-07-16 ENCOUNTER — Telehealth (INDEPENDENT_AMBULATORY_CARE_PROVIDER_SITE_OTHER): Payer: BC Managed Care – PPO | Admitting: Registered Nurse

## 2019-07-16 ENCOUNTER — Other Ambulatory Visit: Payer: Self-pay

## 2019-07-16 DIAGNOSIS — J1282 Pneumonia due to coronavirus disease 2019: Secondary | ICD-10-CM

## 2019-07-16 DIAGNOSIS — R0602 Shortness of breath: Secondary | ICD-10-CM

## 2019-07-16 DIAGNOSIS — U071 COVID-19: Secondary | ICD-10-CM

## 2019-07-16 MED ORDER — ALBUTEROL SULFATE HFA 108 (90 BASE) MCG/ACT IN AERS: 2.0000 | INHALATION_SPRAY | Freq: Four times a day (QID) | RESPIRATORY_TRACT | 0 refills | Status: DC | PRN

## 2019-07-16 MED ORDER — AZITHROMYCIN 250 MG PO TABS: ORAL_TABLET | ORAL | 0 refills | Status: DC

## 2019-07-16 NOTE — Progress Notes (Signed)
Patient states she is calling for a follow up was suppose to return back to work yesterday after being out since covid-19 she states she had to leave due to shortness of breathe. Patient states she don't think she is able to go back right now that's her reason for this call. Still have coughing , wheezing , and shortness of breath. Tylenol makes her feel tired so she stopped taking it. Also thinks she has Pneumonia

## 2019-07-23 ENCOUNTER — Encounter: Payer: Self-pay | Admitting: Registered Nurse

## 2019-07-23 ENCOUNTER — Ambulatory Visit: Payer: BC Managed Care – PPO | Admitting: Registered Nurse

## 2019-07-23 ENCOUNTER — Ambulatory Visit (INDEPENDENT_AMBULATORY_CARE_PROVIDER_SITE_OTHER): Payer: BC Managed Care – PPO

## 2019-07-23 ENCOUNTER — Other Ambulatory Visit: Payer: Self-pay

## 2019-07-23 VITALS — BP 127/85 | HR 97 | Temp 97.8°F | Ht 60.0 in | Wt 133.4 lb

## 2019-07-23 DIAGNOSIS — U071 COVID-19: Secondary | ICD-10-CM

## 2019-07-23 DIAGNOSIS — J1282 Pneumonia due to coronavirus disease 2019: Secondary | ICD-10-CM

## 2019-07-23 MED ORDER — DOXYCYCLINE HYCLATE 100 MG PO TABS: 100.0000 mg | ORAL_TABLET | Freq: Two times a day (BID) | ORAL | 0 refills | Status: AC

## 2019-07-23 MED ORDER — AMOXICILLIN-POT CLAVULANATE ER 1000-62.5 MG PO TB12: 2.0000 | ORAL_TABLET | Freq: Two times a day (BID) | ORAL | 0 refills | Status: DC

## 2019-07-23 NOTE — Patient Instructions (Signed)
° ° ° °  If you have lab work done today you will be contacted with your lab results within the next 2 weeks.  If you have not heard from us then please contact us. The fastest way to get your results is to register for My Chart. ° ° °IF you received an x-ray today, you will receive an invoice from [facility]. Please contact [facility] at [phone] with questions or concerns regarding your invoice.  ° °IF you received labwork today, you will receive an invoice from [facility]. Please contact [facility] at [phone] with questions or concerns regarding your invoice.  ° °Our billing staff will not be able to assist you with questions regarding bills from these companies. ° °You will be contacted with the lab results as soon as they are available. The fastest way to get your results is to activate your My Chart account. Instructions are located on the last page of this paperwork. If you have not heard from us regarding the results in 2 weeks, please contact this office. °  ° ° ° °

## 2019-07-23 NOTE — Progress Notes (Signed)
Telemedicine Encounter- SOAP NOTE Established Patient  This telephone encounter was conducted with the patient's (or proxy's) verbal consent via audio telecommunications: yes  Patient was instructed to have this encounter in a suitably private space; and to only have persons present to whom they give permission to participate. In addition, patient identity was confirmed by use of name plus two identifiers (DOB and address).  I discussed the limitations, risks, security and privacy concerns of performing an evaluation and management service by telephone and the availability of in person appointments. I also discussed with the patient that there may be a patient responsible charge related to this service. The patient expressed understanding and agreed to proceed.  I spent a total of 17 minutes talking with the patient or their proxy.  No chief complaint on file.   Subjective   Alexis Hickman is a 59 y.o. established patient. Telephone visit today for ongoing COVID-19 symptoms  HPI Pt states she has ongoing shob, coughing, wheezing. Has tried taking tylenol otc but not effective. Tried to return to work, shob bad enough she could not.  No nvd, fevers, chills. Some fatigue.  Concerned for ongoing symptoms - wants to return to work but doesn't feel that she is ready. No other concerns today.  Patient Active Problem List   Diagnosis Date Noted  . Acute hypoxemic respiratory failure due to COVID-19 (HCC) 06/07/2019  . Left flank pain 06/20/2016    Past Medical History:  Diagnosis Date  . Allergy     Current Outpatient Medications  Medication Sig Dispense Refill  . acetaminophen (TYLENOL) 325 MG tablet Take 650 mg by mouth every 6 (six) hours as needed for moderate pain or fever.    Marland Kitchen albuterol (VENTOLIN HFA) 108 (90 Base) MCG/ACT inhaler Inhale 2 puffs into the lungs every 6 (six) hours as needed for wheezing or shortness of breath. 18 g 0  . azithromycin (ZITHROMAX) 250 MG tablet Take 2  tabs on first day, then take 1 daily. Finish entire supply. 6 tablet 0  . guaiFENesin-dextromethorphan (ROBITUSSIN DM) 100-10 MG/5ML syrup Take 5 mLs by mouth every 4 (four) hours as needed for cough. (Patient not taking: Reported on 07/16/2019) 118 mL 0   No current facility-administered medications for this visit.    Allergies  Allergen Reactions  . Advil [Ibuprofen]     Makes itchy when taking    Social History   Socioeconomic History  . Marital status: Married    Spouse name: Not on file  . Number of children: Not on file  . Years of education: Not on file  . Highest education level: Not on file  Occupational History  . Not on file  Tobacco Use  . Smoking status: Never Smoker  . Smokeless tobacco: Never Used  Substance and Sexual Activity  . Alcohol use: No    Alcohol/week: 0.0 standard drinks  . Drug use: No  . Sexual activity: Not on file  Other Topics Concern  . Not on file  Social History Narrative  . Not on file   Social Determinants of Health   Financial Resource Strain:   . Difficulty of Paying Living Expenses: Not on file  Food Insecurity:   . Worried About Programme researcher, broadcasting/film/video in the Last Year: Not on file  . Ran Out of Food in the Last Year: Not on file  Transportation Needs:   . Lack of Transportation (Medical): Not on file  . Lack of Transportation (Non-Medical): Not on file  Physical Activity:   . Days of Exercise per Week: Not on file  . Minutes of Exercise per Session: Not on file  Stress:   . Feeling of Stress : Not on file  Social Connections:   . Frequency of Communication with Friends and Family: Not on file  . Frequency of Social Gatherings with Friends and Family: Not on file  . Attends Religious Services: Not on file  . Active Member of Clubs or Organizations: Not on file  . Attends Archivist Meetings: Not on file  . Marital Status: Not on file  Intimate Partner Violence:   . Fear of Current or Ex-Partner: Not on file  .  Emotionally Abused: Not on file  . Physically Abused: Not on file  . Sexually Abused: Not on file    Review of Systems  Constitutional: Positive for malaise/fatigue. Negative for chills, diaphoresis, fever and weight loss.  HENT: Negative.   Eyes: Negative.   Respiratory: Positive for cough, sputum production, shortness of breath and wheezing. Negative for hemoptysis.   Cardiovascular: Negative.   Gastrointestinal: Negative.   Genitourinary: Negative.   Musculoskeletal: Positive for myalgias.  Skin: Negative.   Neurological: Negative.   Endo/Heme/Allergies: Negative.   Psychiatric/Behavioral: Negative.   All other systems reviewed and are negative.   Objective   Vitals as reported by the patient: There were no vitals filed for this visit.  Diagnoses and all orders for this visit:  Pneumonia due to COVID-19 virus -     azithromycin (ZITHROMAX) 250 MG tablet; Take 2 tabs on first day, then take 1 daily. Finish entire supply.  Shortness of breath -     albuterol (VENTOLIN HFA) 108 (90 Base) MCG/ACT inhaler; Inhale 2 puffs into the lungs every 6 (six) hours as needed for wheezing or shortness of breath.   PLAN  Suspect PNa d/t covid-19. Will give z pack  Albuterol PRN for shob  Return to clinic and ED precautions given  Patient encouraged to call clinic with any questions, comments, or concerns.   I discussed the assessment and treatment plan with the patient. The patient was provided an opportunity to ask questions and all were answered. The patient agreed with the plan and demonstrated an understanding of the instructions.   The patient was advised to call back or seek an in-person evaluation if the symptoms worsen or if the condition fails to improve as anticipated.  I provided 17 minutes of non-face-to-face time during this encounter.  Maximiano Coss, NP  Primary [facility] at [facility]

## 2019-07-29 ENCOUNTER — Encounter: Payer: Self-pay | Admitting: Registered Nurse

## 2019-07-29 DIAGNOSIS — U071 COVID-19: Secondary | ICD-10-CM | POA: Insufficient documentation

## 2019-07-29 DIAGNOSIS — J1282 Pneumonia due to coronavirus disease 2019: Secondary | ICD-10-CM | POA: Insufficient documentation

## 2019-07-29 NOTE — Progress Notes (Signed)
Established Patient Office Visit  Subjective:  Patient ID: Alexis Hickman, female    DOB: 15-Sep-1960  Age: 59 y.o. MRN: [id number]  CC:  Chief Complaint  Patient presents with  . Chest Pain    patient is having some chest pain states it hurts when she takes deep breathes and sounds like a wheezing noise. patient stated chest has not gotten better since getting COVID-19 in december. No medication is seeming to help per patient and would like a xray    HPI Staphanie Harbison presents for ongoing COVID symptoms  Having some pain when trying to take deep breaths. Out of breath easily. Extremely fatigued.  No longer coughing or feverish Tried to return to work but could not last through a shift No GI symptoms, neuro symptoms, or CV symptoms  Past Medical History:  Diagnosis Date  . Allergy     No past surgical history on file.  No family history on file.  Social History   Socioeconomic History  . Marital status: Married    Spouse name: Not on file  . Number of children: Not on file  . Years of education: Not on file  . Highest education level: Not on file  Occupational History  . Not on file  Tobacco Use  . Smoking status: Never Smoker  . Smokeless tobacco: Never Used  Substance and Sexual Activity  . Alcohol use: No    Alcohol/week: 0.0 standard drinks  . Drug use: No  . Sexual activity: Not on file  Other Topics Concern  . Not on file  Social History Narrative  . Not on file   Social Determinants of Health   Financial Resource Strain:   . Difficulty of Paying Living Expenses: Not on file  Food Insecurity:   . Worried About Charity fundraiser in the Last Year: Not on file  . Ran Out of Food in the Last Year: Not on file  Transportation Needs:   . Lack of Transportation (Medical): Not on file  . Lack of Transportation (Non-Medical): Not on file  Physical Activity:   . Days of Exercise per Week: Not on file  . Minutes of Exercise per Session: Not on file  Stress:   . Feeling  of Stress : Not on file  Social Connections:   . Frequency of Communication with Friends and Family: Not on file  . Frequency of Social Gatherings with Friends and Family: Not on file  . Attends Religious Services: Not on file  . Active Member of Clubs or Organizations: Not on file  . Attends Archivist Meetings: Not on file  . Marital Status: Not on file  Intimate Partner Violence:   . Fear of Current or Ex-Partner: Not on file  . Emotionally Abused: Not on file  . Physically Abused: Not on file  . Sexually Abused: Not on file    Outpatient Medications Prior to Visit  Medication Sig Dispense Refill  . albuterol (VENTOLIN HFA) 108 (90 Base) MCG/ACT inhaler Inhale 2 puffs into the lungs every 6 (six) hours as needed for wheezing or shortness of breath. 18 g 0  . azithromycin (ZITHROMAX) 250 MG tablet Take 2 tabs on first day, then take 1 daily. Finish entire supply. 6 tablet 0  . acetaminophen (TYLENOL) 325 MG tablet Take 650 mg by mouth every 6 (six) hours as needed for moderate pain or fever.    Marland Kitchen guaiFENesin-dextromethorphan (ROBITUSSIN DM) 100-10 MG/5ML syrup Take 5 mLs by mouth every 4 (four)  hours as needed for cough. (Patient not taking: Reported on 07/16/2019) 118 mL 0   No facility-administered medications prior to visit.    Allergies  Allergen Reactions  . Advil [Ibuprofen]     Makes itchy when taking    ROS Review of Systems Per hpi    Objective:    Physical Exam  Constitutional: She is oriented to person, place, and time. She appears well-developed and well-nourished. No distress.  Cardiovascular: Normal rate, regular rhythm and normal heart sounds. Exam reveals no gallop and no friction rub.  No murmur heard. Pulmonary/Chest: Effort normal. No respiratory distress. She has wheezes (inspiratory, bilateral). She has no rales. She exhibits no tenderness.  Neurological: She is alert and oriented to person, place, and time.  Skin: Skin is warm and dry. No  rash noted. She is not diaphoretic. No erythema. No pallor.  Psychiatric: She has a normal mood and affect. Her behavior is normal. Judgment and thought content normal.  Nursing note and vitals reviewed.   BP 127/85   Pulse 97   Temp 97.8 F (36.6 C) (Temporal)   Ht 5' (1.524 m)   Wt 133 lb 6.4 oz (60.5 kg)   SpO2 93% Comment: 93  BMI 26.05 kg/m  Wt Readings from Last 3 Encounters:  07/23/19 133 lb 6.4 oz (60.5 kg)  06/08/19 127 lb 11.2 oz (57.9 kg)  08/15/17 135 lb (61.2 kg)     Health Maintenance Due  Topic Date Due  . Hepatitis C Screening  11-16-1960  . TETANUS/TDAP  05/30/1977  . PAP SMEAR-Modifier  05/31/1979  . MAMMOGRAM  05/30/2008  . COLONOSCOPY  05/30/2008  . INFLUENZA VACCINE  12/29/2018    There are no preventive care reminders to display for this patient.  Lab Results  Component Value Date   TSH 0.17 (L) 03/28/2016   Lab Results  Component Value Date   WBC 5.7 06/12/2019   HGB 13.2 06/12/2019   HCT 39.9 06/12/2019   MCV 91.1 06/12/2019   PLT 320 06/12/2019   Lab Results  Component Value Date   NA 139 06/12/2019   K 3.9 06/12/2019   CO2 25 06/12/2019   GLUCOSE 104 (H) 06/12/2019   BUN 23 06/12/2019   CREATININE 0.60 06/12/2019   BILITOT 0.5 06/12/2019   ALKPHOS 49 06/12/2019   AST 20 06/12/2019   ALT 22 06/12/2019   PROT 6.3 (L) 06/12/2019   ALBUMIN 2.6 (L) 06/12/2019   CALCIUM 8.0 (L) 06/12/2019   ANIONGAP 8 06/12/2019   No results found for: CHOL No results found for: HDL No results found for: [facility] Lab Results  Component Value Date   TRIG 147 06/07/2019   No results found for: CHOLHDL No results found for: UJWJ1B    Assessment & Plan:   Problem List Items Addressed This Visit    None    Visit Diagnoses    Pneumonia due to COVID-19 virus    -  Primary   Relevant Medications   amoxicillin-clavulanate (AUGMENTIN XR) 1000-62.5 MG 12 hr tablet   doxycycline (VIBRA-TABS) 100 MG tablet   Other Relevant Orders   DG Chest 2  View (Completed)      Meds ordered this encounter  Medications  . amoxicillin-clavulanate (AUGMENTIN XR) 1000-62.5 MG 12 hr tablet    Sig: Take 2 tablets by mouth 2 (two) times daily.    Dispense:  28 tablet    Refill:  0    Order Specific Question:   Supervising Provider  AnswerCollie Siad A [id number]  . doxycycline (VIBRA-TABS) 100 MG tablet    Sig: Take 1 tablet (100 mg total) by mouth 2 (two) times daily.    Dispense:  14 tablet    Refill:  0    Order Specific Question:   Supervising Provider    Answer:   Doristine Bosworth [id number]    Follow-up: Return in 8 days (on 07/31/2019) for repeat x ray, check on pna.   PLAN  Xray shows ongoing COVID pna. Though improved from previous imaging, still of concern   Will give one week course of augmentin and doxycycline, return in 1 week for repeat cxr  Discussed plan with patient, who demonstrates understanding  Patient encouraged to call clinic with any questions, comments, or concerns.  Janeece Agee, NP

## 2019-07-30 ENCOUNTER — Encounter: Payer: Self-pay | Admitting: Registered Nurse

## 2019-07-30 ENCOUNTER — Ambulatory Visit: Payer: BC Managed Care – PPO | Admitting: Registered Nurse

## 2019-07-30 ENCOUNTER — Ambulatory Visit (INDEPENDENT_AMBULATORY_CARE_PROVIDER_SITE_OTHER): Payer: BC Managed Care – PPO

## 2019-07-30 ENCOUNTER — Other Ambulatory Visit: Payer: Self-pay

## 2019-07-30 VITALS — BP 126/87 | HR 92 | Temp 97.6°F | Ht 60.0 in | Wt 133.8 lb

## 2019-07-30 DIAGNOSIS — J1282 Pneumonia due to coronavirus disease 2019: Secondary | ICD-10-CM

## 2019-07-30 DIAGNOSIS — U071 COVID-19: Secondary | ICD-10-CM

## 2019-07-30 NOTE — Patient Instructions (Signed)
° ° ° °  If you have lab work done today you will be contacted with your lab results within the next 2 weeks.  If you have not heard from us then please contact us. The fastest way to get your results is to register for My Chart. ° ° °IF you received an x-ray today, you will receive an invoice from [facility]. Please contact [facility] at [phone] with questions or concerns regarding your invoice.  ° °IF you received labwork today, you will receive an invoice from [facility]. Please contact [facility] at [phone] with questions or concerns regarding your invoice.  ° °Our billing staff will not be able to assist you with questions regarding bills from these companies. ° °You will be contacted with the lab results as soon as they are available. The fastest way to get your results is to activate your My Chart account. Instructions are located on the last page of this paperwork. If you have not heard from us regarding the results in 2 weeks, please contact this office. °  ° ° ° °

## 2019-08-03 ENCOUNTER — Encounter: Payer: Self-pay | Admitting: Registered Nurse

## 2019-08-03 ENCOUNTER — Other Ambulatory Visit: Payer: Self-pay | Admitting: Registered Nurse

## 2019-08-03 DIAGNOSIS — R0602 Shortness of breath: Secondary | ICD-10-CM

## 2019-08-03 NOTE — Progress Notes (Signed)
Acute Office Visit  Subjective:    Patient ID: Alexis Hickman, female    DOB: 07/16/1960, 59 y.o.   MRN: [id number]  Chief Complaint  Patient presents with  . Follow-up    follow up from having Pnemonia and needs a Xray    HPI Patient is in today for follow up xray for covid pneumonia  Treated with doxycycline and augmentin. Finished the course of each without incident Feeling much improved, respiratory symptoms much better Still some fatigue, unsure that she feels able to return to unrestricted duty at work just yet  Otherwise no concerns. Here today with her son  Past Medical History:  Diagnosis Date  . Allergy     No past surgical history on file.  No family history on file.  Social History   Socioeconomic History  . Marital status: Married    Spouse name: Not on file  . Number of children: Not on file  . Years of education: Not on file  . Highest education level: Not on file  Occupational History  . Not on file  Tobacco Use  . Smoking status: Never Smoker  . Smokeless tobacco: Never Used  Substance and Sexual Activity  . Alcohol use: No    Alcohol/week: 0.0 standard drinks  . Drug use: No  . Sexual activity: Not on file  Other Topics Concern  . Not on file  Social History Narrative  . Not on file   Social Determinants of Health   Financial Resource Strain:   . Difficulty of Paying Living Expenses: Not on file  Food Insecurity:   . Worried About Programme researcher, broadcasting/film/video in the Last Year: Not on file  . Ran Out of Food in the Last Year: Not on file  Transportation Needs:   . Lack of Transportation (Medical): Not on file  . Lack of Transportation (Non-Medical): Not on file  Physical Activity:   . Days of Exercise per Week: Not on file  . Minutes of Exercise per Session: Not on file  Stress:   . Feeling of Stress : Not on file  Social Connections:   . Frequency of Communication with Friends and Family: Not on file  . Frequency of Social Gatherings with  Friends and Family: Not on file  . Attends Religious Services: Not on file  . Active Member of Clubs or Organizations: Not on file  . Attends Banker Meetings: Not on file  . Marital Status: Not on file  Intimate Partner Violence:   . Fear of Current or Ex-Partner: Not on file  . Emotionally Abused: Not on file  . Physically Abused: Not on file  . Sexually Abused: Not on file    Outpatient Medications Prior to Visit  Medication Sig Dispense Refill  . acetaminophen (TYLENOL) 325 MG tablet Take 650 mg by mouth every 6 (six) hours as needed for moderate pain or fever.    Marland Kitchen albuterol (VENTOLIN HFA) 108 (90 Base) MCG/ACT inhaler Inhale 2 puffs into the lungs every 6 (six) hours as needed for wheezing or shortness of breath. 18 g 0  . amoxicillin-clavulanate (AUGMENTIN XR) 1000-62.5 MG 12 hr tablet Take 2 tablets by mouth 2 (two) times daily. 28 tablet 0  . azithromycin (ZITHROMAX) 250 MG tablet Take 2 tabs on first day, then take 1 daily. Finish entire supply. 6 tablet 0  . doxycycline (VIBRA-TABS) 100 MG tablet Take 1 tablet (100 mg total) by mouth 2 (two) times daily. 14 tablet 0  .  guaiFENesin-dextromethorphan (ROBITUSSIN DM) 100-10 MG/5ML syrup Take 5 mLs by mouth every 4 (four) hours as needed for cough. 118 mL 0   No facility-administered medications prior to visit.    Allergies  Allergen Reactions  . Advil [Ibuprofen]     Makes itchy when taking    Review of Systems  Constitutional: Negative.   HENT: Negative.   Eyes: Negative.   Respiratory: Negative.   Cardiovascular: Negative.   Gastrointestinal: Negative.   Endocrine: Negative.   Genitourinary: Negative.   Musculoskeletal: Negative.   Skin: Negative.   Allergic/Immunologic: Negative.   Neurological: Negative.   Hematological: Negative.   Psychiatric/Behavioral: Negative.   All other systems reviewed and are negative.      Objective:    Physical Exam Vitals and nursing note reviewed.   Constitutional:      General: She is not in acute distress.    Appearance: Normal appearance. She is normal weight. She is not toxic-appearing or diaphoretic.  Cardiovascular:     Rate and Rhythm: Normal rate and regular rhythm.  Pulmonary:     Effort: Pulmonary effort is normal. No respiratory distress.     Breath sounds: Normal breath sounds. No stridor. No wheezing, rhonchi or rales.  Chest:     Chest wall: No tenderness.  Skin:    Capillary Refill: Capillary refill takes less than 2 seconds.  Neurological:     General: No focal deficit present.     Mental Status: She is alert and oriented to person, place, and time. Mental status is at baseline.  Psychiatric:        Mood and Affect: Mood normal.        Behavior: Behavior normal.        Thought Content: Thought content normal.        Judgment: Judgment normal.     BP 126/87   Pulse 92   Temp 97.6 F (36.4 C) (Temporal)   Ht 5' (1.524 m)   Wt 133 lb 12.8 oz (60.7 kg)   SpO2 96%   BMI 26.13 kg/m  Wt Readings from Last 3 Encounters:  07/30/19 133 lb 12.8 oz (60.7 kg)  07/23/19 133 lb 6.4 oz (60.5 kg)  06/08/19 127 lb 11.2 oz (57.9 kg)    Health Maintenance Due  Topic Date Due  . Hepatitis C Screening  06-Feb-1961  . PAP SMEAR-Modifier  05/31/1979  . MAMMOGRAM  05/30/2008  . COLONOSCOPY  05/30/2008    There are no preventive care reminders to display for this patient.   Lab Results  Component Value Date   TSH 0.17 (L) 03/28/2016   Lab Results  Component Value Date   WBC 5.7 06/12/2019   HGB 13.2 06/12/2019   HCT 39.9 06/12/2019   MCV 91.1 06/12/2019   PLT 320 06/12/2019   Lab Results  Component Value Date   NA 139 06/12/2019   K 3.9 06/12/2019   CO2 25 06/12/2019   GLUCOSE 104 (H) 06/12/2019   BUN 23 06/12/2019   CREATININE 0.60 06/12/2019   BILITOT 0.5 06/12/2019   ALKPHOS 49 06/12/2019   AST 20 06/12/2019   ALT 22 06/12/2019   PROT 6.3 (L) 06/12/2019   ALBUMIN 2.6 (L) 06/12/2019   CALCIUM  8.0 (L) 06/12/2019   ANIONGAP 8 06/12/2019   No results found for: CHOL No results found for: HDL No results found for: [facility] Lab Results  Component Value Date   TRIG 147 06/07/2019   No results found for: CHOLHDL No results found  for: HGBA1C     Assessment & Plan:   Problem List Items Addressed This Visit      Respiratory   Pneumonia due to COVID-19 virus - Primary   Relevant Orders   DG Chest 2 View (Completed)       No orders of the defined types were placed in this encounter.  PLAN  Xray shows improvement of pna  Continue supportive therapy  Return to work next week - written note asking for accommodations for her fatigue  Return to clinic precautions given  Patient encouraged to call clinic with any questions, comments, or concerns.  Janeece Agee, NP

## 2019-08-19 ENCOUNTER — Other Ambulatory Visit: Payer: Self-pay

## 2019-08-19 ENCOUNTER — Telehealth (INDEPENDENT_AMBULATORY_CARE_PROVIDER_SITE_OTHER): Payer: BC Managed Care – PPO | Admitting: Registered Nurse

## 2019-08-19 ENCOUNTER — Encounter: Payer: Self-pay | Admitting: Registered Nurse

## 2019-08-19 DIAGNOSIS — U071 COVID-19: Secondary | ICD-10-CM

## 2019-08-19 DIAGNOSIS — J1282 Pneumonia due to coronavirus disease 2019: Secondary | ICD-10-CM | POA: Diagnosis not present

## 2019-08-19 NOTE — Progress Notes (Signed)
Telemedicine Encounter- SOAP NOTE Established Patient  This telephone encounter was conducted with the patient's (or proxy's) verbal consent via audio telecommunications: yes  Patient was instructed to have this encounter in a suitably private space; and to only have persons present to whom they give permission to participate. In addition, patient identity was confirmed by use of name plus two identifiers (DOB and address).  I discussed the limitations, risks, security and privacy concerns of performing an evaluation and management service by telephone and the availability of in person appointments. I also discussed with the patient that there may be a patient responsible charge related to this service. The patient expressed understanding and agreed to proceed.  I spent a total of 11 minutes talking with the patient or their proxy.  Chief Complaint  Patient presents with  . Follow-up    patient states she had covid and has been out of work for two weeks and  no longer needs the oxygen machine and is doing     Subjective   Alexis Hickman is a 59 y.o. established patient. Telephone visit today for COVID follow up   HPI Feeling much improved. Has been back to work for two weeks No fatigue, shob, difficulty breathing. No residual symptoms or concerns Had supplemental oxygen dme from admission to hospital - she needs provider's orders to return this.   [id number] Alexis Hickman - interpreter  [phone] - fax number to return dme   Patient Active Problem List   Diagnosis Date Noted  . Pneumonia due to COVID-19 virus 07/29/2019  . Acute hypoxemic respiratory failure due to COVID-19 ([place]) 06/07/2019  . Left flank pain 06/20/2016    Past Medical History:  Diagnosis Date  . Allergy     Current Outpatient Medications  Medication Sig Dispense Refill  . acetaminophen (TYLENOL) 325 MG tablet Take 650 mg by mouth every 6 (six) hours as needed for moderate pain or fever.    Marland Kitchen albuterol (VENTOLIN  HFA) 108 (90 Base) MCG/ACT inhaler TAKE 2 PUFFS BY MOUTH EVERY 6 HOURS AS NEEDED FOR WHEEZE OR SHORTNESS OF BREATH 18 g 0  . doxycycline (VIBRA-TABS) 100 MG tablet Take 1 tablet (100 mg total) by mouth 2 (two) times daily. 14 tablet 0  . guaiFENesin-dextromethorphan (ROBITUSSIN DM) 100-10 MG/5ML syrup Take 5 mLs by mouth every 4 (four) hours as needed for cough. 118 mL 0   No current facility-administered medications for this visit.    Allergies  Allergen Reactions  . Advil [Ibuprofen]     Makes itchy when taking    Social History   Socioeconomic History  . Marital status: Married    Spouse name: Not on file  . Number of children: Not on file  . Years of education: Not on file  . Highest education level: Not on file  Occupational History  . Not on file  Tobacco Use  . Smoking status: Never Smoker  . Smokeless tobacco: Never Used  Substance and Sexual Activity  . Alcohol use: No    Alcohol/week: 0.0 standard drinks  . Drug use: No  . Sexual activity: Not on file  Other Topics Concern  . Not on file  Social History Narrative  . Not on file   Social Determinants of Health   Financial Resource Strain:   . Difficulty of Paying Living Expenses:   Food Insecurity:   . Worried About Charity fundraiser in the Last Year:   . [place] in the Last Year:  Transportation Needs:   . Freight forwarder (Medical):   Marland Kitchen Lack of Transportation (Non-Medical):   Physical Activity:   . Days of Exercise per Week:   . Minutes of Exercise per Session:   Stress:   . Feeling of Stress :   Social Connections:   . Frequency of Communication with Friends and Family:   . Frequency of Social Gatherings with Friends and Family:   . Attends Religious Services:   . Active Member of Clubs or Organizations:   . Attends Banker Meetings:   Marland Kitchen Marital Status:   Intimate Partner Violence:   . Fear of Current or Ex-Partner:   . Emotionally Abused:   Marland Kitchen Physically Abused:     . Sexually Abused:     Review of Systems  Constitutional: Negative.   HENT: Negative.   Eyes: Negative.   Respiratory: Negative.   Cardiovascular: Negative.   Gastrointestinal: Negative.   Genitourinary: Negative.   Musculoskeletal: Negative.   Skin: Negative.   Neurological: Negative.   Endo/Heme/Allergies: Negative.   Psychiatric/Behavioral: Negative.   All other systems reviewed and are negative.   Objective   Vitals as reported by the patient: There were no vitals filed for this visit.  Alexis Hickman was seen today for follow-up.  Diagnoses and all orders for this visit:  Pneumonia due to COVID-19 virus   PLAN  Pt fully recovered and resuming normal activity without concern  Will send fax to return dme  Patient encouraged to call clinic with any questions, comments, or concerns.   I discussed the assessment and treatment plan with the patient. The patient was provided an opportunity to ask questions and all were answered. The patient agreed with the plan and demonstrated an understanding of the instructions.   The patient was advised to call back or seek an in-person evaluation if the symptoms worsen or if the condition fails to improve as anticipated.  I provided 13 minutes of non-face-to-face time during this encounter.  Alexis Agee, NP  [facility]

## 2019-08-19 NOTE — Patient Instructions (Signed)
° ° ° °  If you have lab work done today you will be contacted with your lab results within the next 2 weeks.  If you have not heard from us then please contact us. The fastest way to get your results is to register for My Chart. ° ° °IF you received an x-ray today, you will receive an invoice from [facility]. Please contact [facility] at [phone] with questions or concerns regarding your invoice.  ° °IF you received labwork today, you will receive an invoice from [facility]. Please contact [facility] at [phone] with questions or concerns regarding your invoice.  ° °Our billing staff will not be able to assist you with questions regarding bills from these companies. ° °You will be contacted with the lab results as soon as they are available. The fastest way to get your results is to activate your My Chart account. Instructions are located on the last page of this paperwork. If you have not heard from us regarding the results in 2 weeks, please contact this office. °  ° ° ° °

## 2019-08-26 ENCOUNTER — Other Ambulatory Visit: Payer: Self-pay | Admitting: Registered Nurse

## 2019-08-26 DIAGNOSIS — R0602 Shortness of breath: Secondary | ICD-10-CM

## 2019-11-06 NOTE — Congregational Nurse Program (Signed)
CN office visit with interpreter Diu Hartshorn assisting.  Patient requesting help to call [facility]illing office to establish payment plan for remaining bill of $1606.  She agreed to pay a minimum of $146 per month for 11 months which will begin as soon as she mails her first installment. States she will mail it today.  Brantley Fling RN, Congregational Nurse [phone]

## 2021-10-21 IMAGING — DX DG CHEST 1V PORT
1 series · 1 of 1 positions shown · non-contrast
Comparison: Chest radiograph 08/15/2017

CLINICAL DATA: COVID, shortness of breath.

EXAM:
PORTABLE CHEST 1 VIEW

[chest ap]
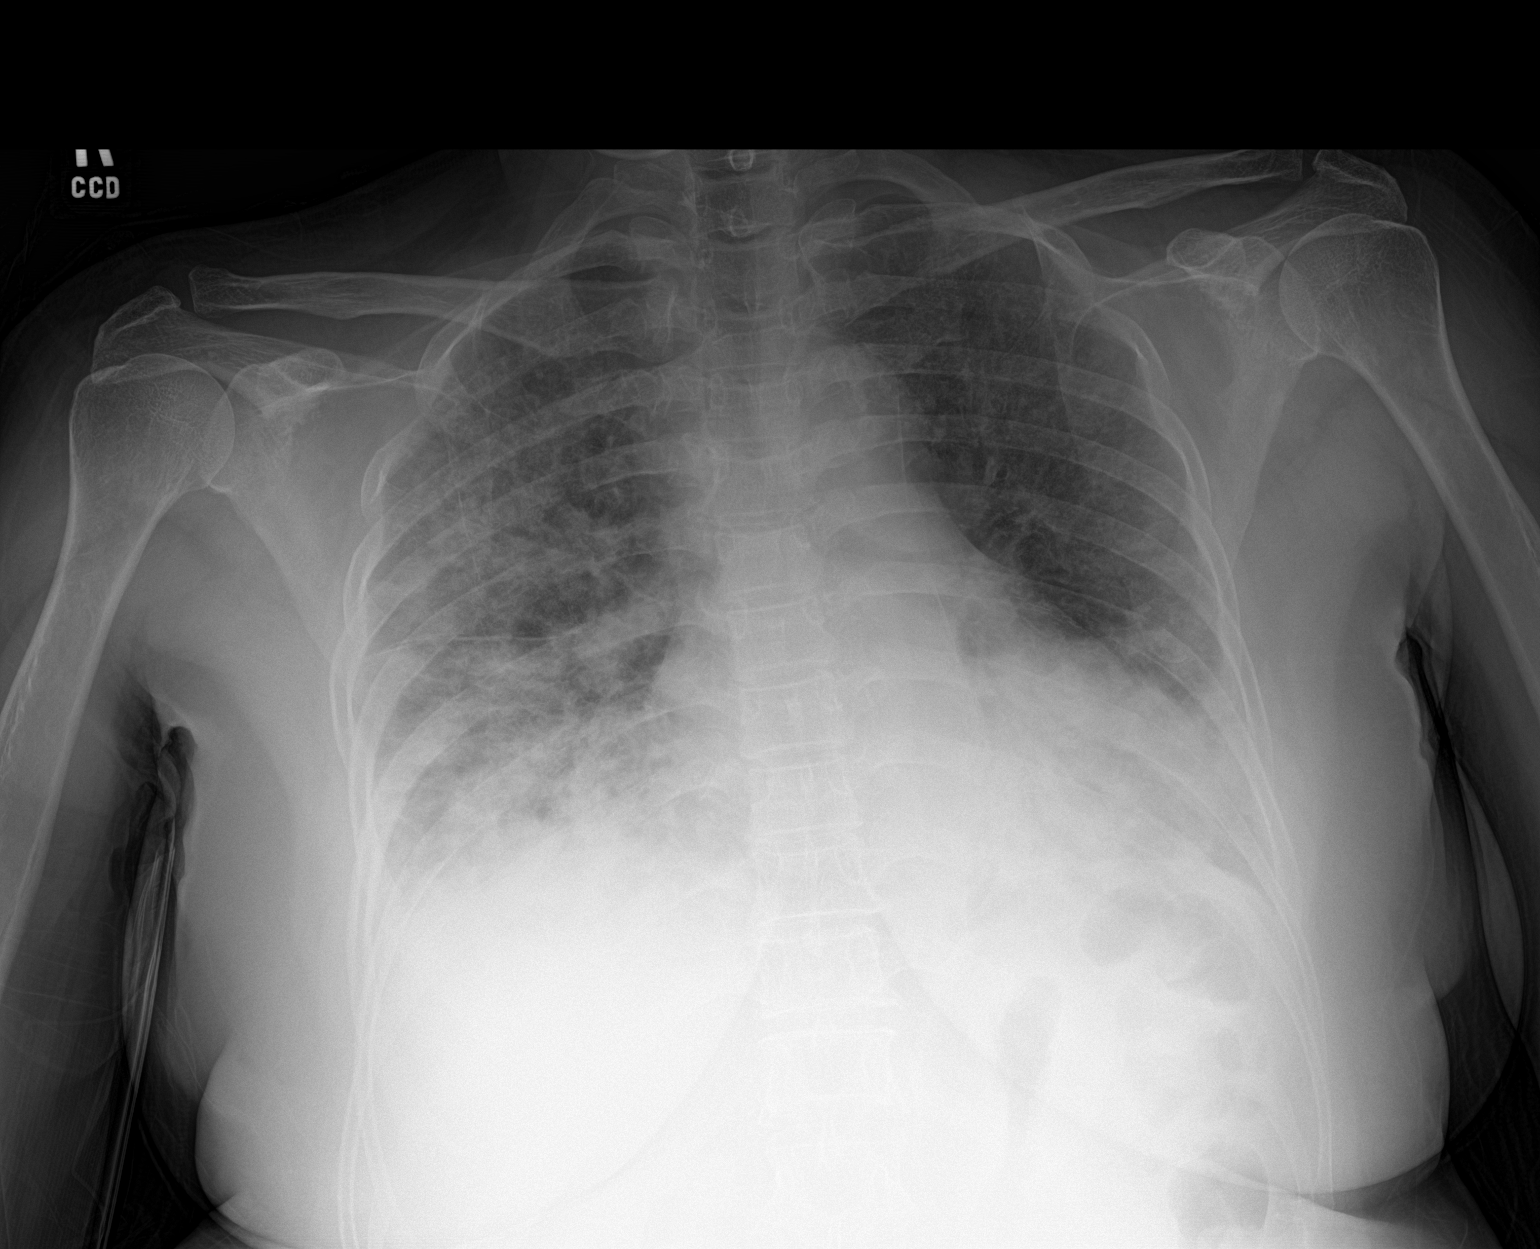

[1 of 1 positions shown; findings below may reference images not displayed]

FINDINGS: Heart size within normal limits. Prominent airspace disease
throughout the majority of the right lung and within the left lung
base. A trace right pleural effusion is difficult to exclude. No
evidence of pneumothorax. No acute bony abnormality.
IMPRESSION: Prominent airspace disease throughout the majority of the right lung
and within the left lung base. Findings are consistent with
multifocal pneumonia. Radiographic follow-up to resolution
recommended.

## 2021-12-13 IMAGING — DX DG CHEST 2V
2 series · 2 of 2 positions shown · non-contrast
Comparison: 07/23/2019 chest radiograph.

CLINICAL DATA: L9KLK-5I pneumonia, follow-up

EXAM:
CHEST - 2 VIEW

[chest pa]
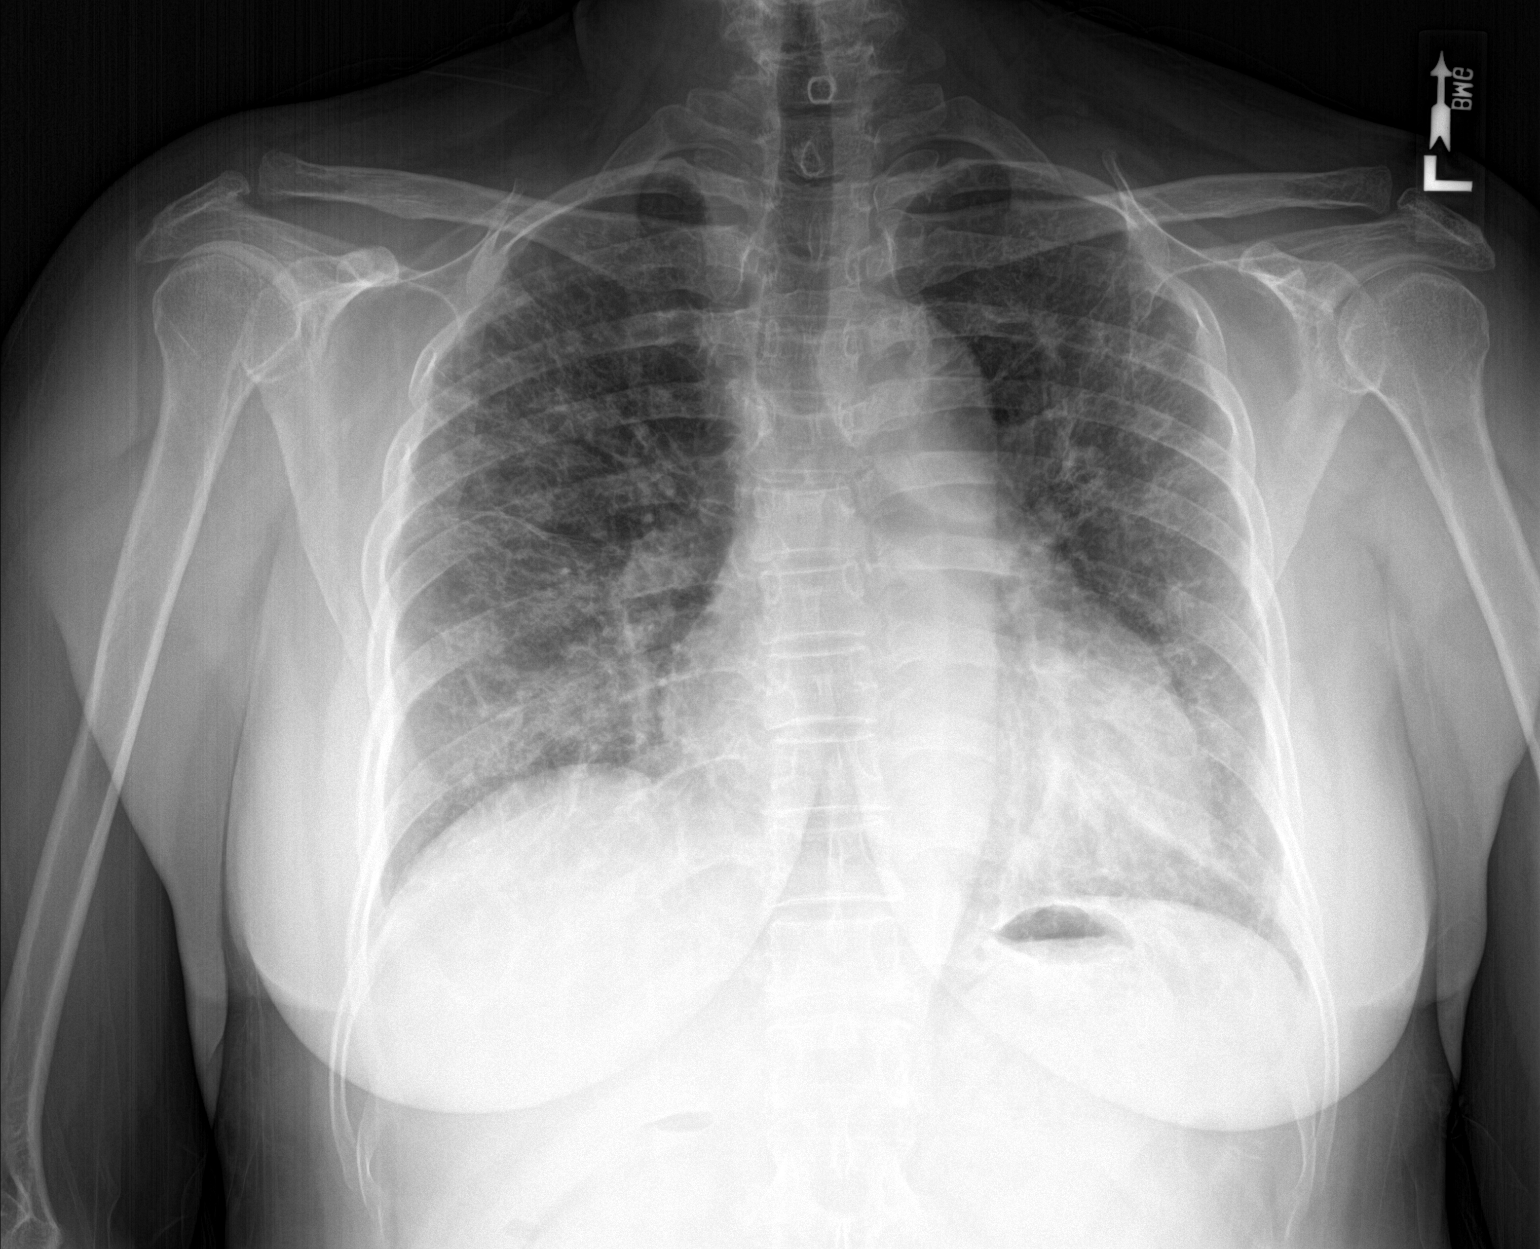

[chest lat]
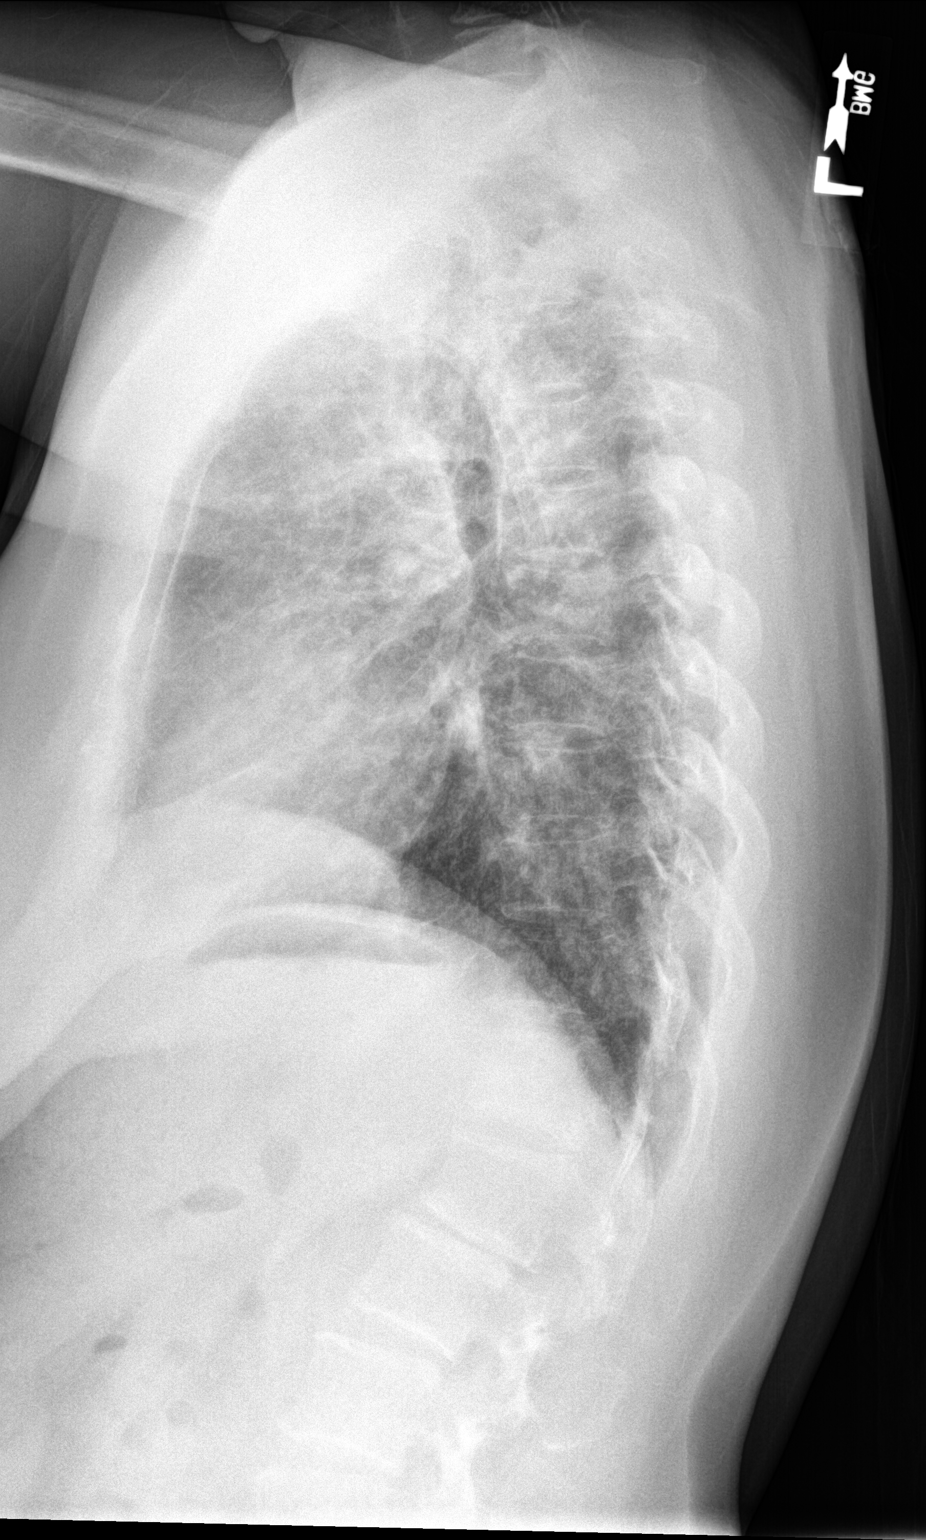

[2 of 2 positions shown; findings below may reference images not displayed]

FINDINGS: Stable cardiomediastinal silhouette with top-normal heart size. No
pneumothorax. No pleural effusion. Widespread patchy hazy opacities
throughout both lungs, minimally improved.
IMPRESSION: Widespread patchy hazy opacities throughout both lungs, minimally
improved. Findings are compatible with slow resolution of L9KLK-5I
pneumonia, although a component of postinflammatory fibrosis is not
excluded. Follow-up high-resolution chest CT study in 3-6 months may
be considered as clinically warranted.

## 2024-06-26 ENCOUNTER — Encounter (HOSPITAL_COMMUNITY): Payer: Self-pay

## 2024-06-26 ENCOUNTER — Emergency Department (HOSPITAL_COMMUNITY)

## 2024-06-26 ENCOUNTER — Emergency Department (HOSPITAL_COMMUNITY)
Admission: EM | Admit: 2024-06-26 | Discharge: 2024-06-26 | Disposition: A | Discharge status: Home / Self Care | Attending: Emergency Medicine | Admitting: Emergency Medicine
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
